# Patient Record
Sex: Female | Born: 1955
Health system: Southern US, Community
[De-identification: ages and names within clinical notes are randomized; demographics above are authoritative.]

## PROBLEM LIST (undated history)

## (undated) DIAGNOSIS — Z30430 Encounter for insertion of intrauterine contraceptive device: Secondary | ICD-10-CM

## (undated) DIAGNOSIS — IMO0002 Reserved for concepts with insufficient information to code with codable children: Secondary | ICD-10-CM

## (undated) DIAGNOSIS — F419 Anxiety disorder, unspecified: Secondary | ICD-10-CM

## (undated) DIAGNOSIS — M533 Sacrococcygeal disorders, not elsewhere classified: Secondary | ICD-10-CM

## (undated) DIAGNOSIS — M8580 Other specified disorders of bone density and structure, unspecified site: Secondary | ICD-10-CM

## (undated) DIAGNOSIS — G4730 Sleep apnea, unspecified: Secondary | ICD-10-CM

## (undated) DIAGNOSIS — N189 Chronic kidney disease, unspecified: Secondary | ICD-10-CM

## (undated) DIAGNOSIS — Z9889 Other specified postprocedural states: Secondary | ICD-10-CM

## (undated) DIAGNOSIS — D219 Benign neoplasm of connective and other soft tissue, unspecified: Secondary | ICD-10-CM

## (undated) DIAGNOSIS — M1990 Unspecified osteoarthritis, unspecified site: Secondary | ICD-10-CM

## (undated) DIAGNOSIS — R809 Proteinuria, unspecified: Secondary | ICD-10-CM

## (undated) DIAGNOSIS — F32A Depression, unspecified: Secondary | ICD-10-CM

## (undated) DIAGNOSIS — M549 Dorsalgia, unspecified: Secondary | ICD-10-CM

## (undated) DIAGNOSIS — C801 Malignant (primary) neoplasm, unspecified: Secondary | ICD-10-CM

## (undated) DIAGNOSIS — B029 Zoster without complications: Secondary | ICD-10-CM

## (undated) DIAGNOSIS — R112 Nausea with vomiting, unspecified: Secondary | ICD-10-CM

## (undated) HISTORY — DX: Other specified disorders of bone density and structure, unspecified site: M85.80

## (undated) HISTORY — PX: WISDOM TOOTH EXTRACTION: SHX21

## (undated) HISTORY — DX: Benign neoplasm of connective and other soft tissue, unspecified: D21.9

## (undated) HISTORY — DX: Sacrococcygeal disorders, not elsewhere classified: M53.3

## (undated) HISTORY — PX: COLONOSCOPY: SHX174

## (undated) HISTORY — PX: MULTIPLE TOOTH EXTRACTIONS: SHX2053

## (undated) HISTORY — PX: BUNIONECTOMY: SHX129

## (undated) HISTORY — DX: Encounter for insertion of intrauterine contraceptive device: Z30.430

## (undated) HISTORY — DX: Dorsalgia, unspecified: M54.9

## (undated) HISTORY — DX: Reserved for concepts with insufficient information to code with codable children: IMO0002

## (undated) HISTORY — DX: Proteinuria, unspecified: R80.9

---

## 1998-12-07 ENCOUNTER — Encounter: Payer: Self-pay | Admitting: Obstetrics and Gynecology

## 1998-12-07 ENCOUNTER — Ambulatory Visit (HOSPITAL_COMMUNITY): Admission: RE | Admit: 1998-12-07 | Discharge: 1998-12-07 | Payer: Self-pay | Admitting: Obstetrics and Gynecology

## 1998-12-09 ENCOUNTER — Ambulatory Visit (HOSPITAL_COMMUNITY): Admission: RE | Admit: 1998-12-09 | Discharge: 1998-12-09 | Payer: Self-pay | Admitting: Obstetrics and Gynecology

## 1998-12-09 ENCOUNTER — Encounter: Payer: Self-pay | Admitting: Obstetrics and Gynecology

## 1999-01-04 ENCOUNTER — Other Ambulatory Visit: Admission: RE | Admit: 1999-01-04 | Discharge: 1999-01-04 | Payer: Self-pay | Admitting: Obstetrics and Gynecology

## 2000-01-10 ENCOUNTER — Other Ambulatory Visit: Admission: RE | Admit: 2000-01-10 | Discharge: 2000-01-10 | Payer: Self-pay | Admitting: Obstetrics and Gynecology

## 2000-01-25 ENCOUNTER — Encounter: Payer: Self-pay | Admitting: Obstetrics and Gynecology

## 2000-01-25 ENCOUNTER — Ambulatory Visit (HOSPITAL_COMMUNITY): Admission: RE | Admit: 2000-01-25 | Discharge: 2000-01-25 | Payer: Self-pay | Admitting: Obstetrics and Gynecology

## 2000-08-05 ENCOUNTER — Emergency Department (HOSPITAL_COMMUNITY): Admission: EM | Admit: 2000-08-05 | Discharge: 2000-08-05 | Payer: Self-pay | Admitting: Emergency Medicine

## 2000-12-19 ENCOUNTER — Other Ambulatory Visit: Admission: RE | Admit: 2000-12-19 | Discharge: 2000-12-19 | Payer: Self-pay | Admitting: Obstetrics and Gynecology

## 2001-01-29 ENCOUNTER — Ambulatory Visit (HOSPITAL_COMMUNITY): Admission: RE | Admit: 2001-01-29 | Discharge: 2001-01-29 | Payer: Self-pay | Admitting: Obstetrics and Gynecology

## 2001-01-29 ENCOUNTER — Encounter: Payer: Self-pay | Admitting: Obstetrics and Gynecology

## 2001-02-05 ENCOUNTER — Encounter: Admission: RE | Admit: 2001-02-05 | Discharge: 2001-02-05 | Payer: Self-pay | Admitting: Obstetrics and Gynecology

## 2001-02-05 ENCOUNTER — Encounter: Payer: Self-pay | Admitting: Obstetrics and Gynecology

## 2002-01-15 ENCOUNTER — Other Ambulatory Visit: Admission: RE | Admit: 2002-01-15 | Discharge: 2002-01-15 | Payer: Self-pay | Admitting: Obstetrics and Gynecology

## 2002-02-06 ENCOUNTER — Ambulatory Visit (HOSPITAL_COMMUNITY): Admission: RE | Admit: 2002-02-06 | Discharge: 2002-02-06 | Payer: Self-pay | Admitting: Obstetrics and Gynecology

## 2002-02-06 ENCOUNTER — Encounter: Payer: Self-pay | Admitting: Obstetrics and Gynecology

## 2003-02-04 ENCOUNTER — Other Ambulatory Visit: Admission: RE | Admit: 2003-02-04 | Discharge: 2003-02-04 | Payer: Self-pay | Admitting: Obstetrics and Gynecology

## 2003-02-10 ENCOUNTER — Ambulatory Visit (HOSPITAL_COMMUNITY): Admission: RE | Admit: 2003-02-10 | Discharge: 2003-02-10 | Payer: Self-pay | Admitting: Obstetrics and Gynecology

## 2004-02-16 ENCOUNTER — Ambulatory Visit (HOSPITAL_COMMUNITY): Admission: RE | Admit: 2004-02-16 | Discharge: 2004-02-16 | Payer: Self-pay | Admitting: Obstetrics and Gynecology

## 2004-03-29 ENCOUNTER — Other Ambulatory Visit: Admission: RE | Admit: 2004-03-29 | Discharge: 2004-03-29 | Payer: Self-pay | Admitting: Obstetrics and Gynecology

## 2004-04-17 DIAGNOSIS — IMO0002 Reserved for concepts with insufficient information to code with codable children: Secondary | ICD-10-CM

## 2004-04-17 HISTORY — DX: Reserved for concepts with insufficient information to code with codable children: IMO0002

## 2005-02-28 ENCOUNTER — Ambulatory Visit (HOSPITAL_COMMUNITY): Admission: RE | Admit: 2005-02-28 | Discharge: 2005-02-28 | Payer: Self-pay | Admitting: Obstetrics and Gynecology

## 2005-03-29 ENCOUNTER — Other Ambulatory Visit: Admission: RE | Admit: 2005-03-29 | Discharge: 2005-03-29 | Payer: Self-pay | Admitting: Obstetrics and Gynecology

## 2006-03-06 ENCOUNTER — Ambulatory Visit (HOSPITAL_COMMUNITY): Admission: RE | Admit: 2006-03-06 | Discharge: 2006-03-06 | Payer: Self-pay | Admitting: Obstetrics and Gynecology

## 2006-04-17 DIAGNOSIS — Z30430 Encounter for insertion of intrauterine contraceptive device: Secondary | ICD-10-CM

## 2006-04-17 HISTORY — DX: Encounter for insertion of intrauterine contraceptive device: Z30.430

## 2007-01-18 ENCOUNTER — Encounter: Admission: RE | Admit: 2007-01-18 | Discharge: 2007-01-18 | Payer: Self-pay | Admitting: Family Medicine

## 2007-03-12 ENCOUNTER — Ambulatory Visit (HOSPITAL_COMMUNITY): Admission: RE | Admit: 2007-03-12 | Discharge: 2007-03-12 | Payer: Self-pay | Admitting: Obstetrics and Gynecology

## 2007-03-26 ENCOUNTER — Encounter: Admission: RE | Admit: 2007-03-26 | Discharge: 2007-03-26 | Payer: Self-pay | Admitting: Family Medicine

## 2007-04-22 ENCOUNTER — Encounter: Admission: RE | Admit: 2007-04-22 | Discharge: 2007-04-22 | Payer: Self-pay | Admitting: Family Medicine

## 2007-05-02 ENCOUNTER — Encounter: Admission: RE | Admit: 2007-05-02 | Discharge: 2007-05-02 | Payer: Self-pay | Admitting: Family Medicine

## 2007-05-21 ENCOUNTER — Encounter: Admission: RE | Admit: 2007-05-21 | Discharge: 2007-05-21 | Payer: Self-pay | Admitting: Family Medicine

## 2007-11-22 ENCOUNTER — Encounter: Admission: RE | Admit: 2007-11-22 | Discharge: 2007-11-22 | Payer: Self-pay | Admitting: Family Medicine

## 2007-11-25 ENCOUNTER — Encounter: Admission: RE | Admit: 2007-11-25 | Discharge: 2007-11-25 | Payer: Self-pay | Admitting: Family Medicine

## 2007-12-04 ENCOUNTER — Encounter: Admission: RE | Admit: 2007-12-04 | Discharge: 2007-12-04 | Payer: Self-pay | Admitting: Family Medicine

## 2007-12-25 ENCOUNTER — Encounter: Admission: RE | Admit: 2007-12-25 | Discharge: 2007-12-25 | Payer: Self-pay | Admitting: Family Medicine

## 2008-02-24 ENCOUNTER — Encounter: Admission: RE | Admit: 2008-02-24 | Discharge: 2008-02-24 | Payer: Self-pay | Admitting: Family Medicine

## 2008-03-23 ENCOUNTER — Ambulatory Visit (HOSPITAL_COMMUNITY): Admission: RE | Admit: 2008-03-23 | Discharge: 2008-03-23 | Payer: Self-pay | Admitting: Obstetrics and Gynecology

## 2008-04-02 ENCOUNTER — Encounter: Admission: RE | Admit: 2008-04-02 | Discharge: 2008-04-02 | Payer: Self-pay | Admitting: Obstetrics and Gynecology

## 2009-03-24 ENCOUNTER — Ambulatory Visit (HOSPITAL_COMMUNITY): Admission: RE | Admit: 2009-03-24 | Discharge: 2009-03-24 | Payer: Self-pay | Admitting: Obstetrics and Gynecology

## 2010-03-25 ENCOUNTER — Encounter
Admission: RE | Admit: 2010-03-25 | Discharge: 2010-03-25 | Payer: Self-pay | Source: Home / Self Care | Attending: Family Medicine | Admitting: Family Medicine

## 2010-04-17 DIAGNOSIS — C439 Malignant melanoma of skin, unspecified: Secondary | ICD-10-CM | POA: Insufficient documentation

## 2011-02-23 ENCOUNTER — Other Ambulatory Visit: Payer: Self-pay | Admitting: Family Medicine

## 2011-02-23 DIAGNOSIS — Z1231 Encounter for screening mammogram for malignant neoplasm of breast: Secondary | ICD-10-CM

## 2011-03-28 ENCOUNTER — Ambulatory Visit
Admission: RE | Admit: 2011-03-28 | Discharge: 2011-03-28 | Disposition: A | Discharge status: Home / Self Care | Payer: BC Managed Care – PPO | Source: Ambulatory Visit | Attending: Family Medicine | Admitting: Family Medicine

## 2011-03-28 DIAGNOSIS — Z1231 Encounter for screening mammogram for malignant neoplasm of breast: Secondary | ICD-10-CM

## 2011-08-23 ENCOUNTER — Telehealth: Payer: Self-pay | Admitting: Obstetrics and Gynecology

## 2011-08-23 NOTE — Telephone Encounter (Signed)
triage

## 2011-08-23 NOTE — Telephone Encounter (Signed)
Tc to pt. Pt c/o forgetfulness and problems with cognitive thinking. Pt states,"it is effecting normal daily life". Pt has researched that low testosterone could be a factor. Appt sched 09-14-11 with vph to discuss. Pt voices understanding.

## 2011-09-14 ENCOUNTER — Ambulatory Visit (INDEPENDENT_AMBULATORY_CARE_PROVIDER_SITE_OTHER): Payer: BC Managed Care – PPO | Admitting: Obstetrics and Gynecology

## 2011-09-14 ENCOUNTER — Encounter: Payer: Self-pay | Admitting: Obstetrics and Gynecology

## 2011-09-14 VITALS — BP 124/82 | Temp 98.7°F | Wt 155.0 lb

## 2011-09-14 DIAGNOSIS — N951 Menopausal and female climacteric states: Secondary | ICD-10-CM

## 2011-09-14 DIAGNOSIS — R809 Proteinuria, unspecified: Secondary | ICD-10-CM

## 2011-09-14 DIAGNOSIS — M533 Sacrococcygeal disorders, not elsewhere classified: Secondary | ICD-10-CM

## 2011-09-14 DIAGNOSIS — M549 Dorsalgia, unspecified: Secondary | ICD-10-CM

## 2011-09-14 DIAGNOSIS — M899 Disorder of bone, unspecified: Secondary | ICD-10-CM

## 2011-09-14 DIAGNOSIS — G4700 Insomnia, unspecified: Secondary | ICD-10-CM

## 2011-09-14 DIAGNOSIS — M949 Disorder of cartilage, unspecified: Secondary | ICD-10-CM

## 2011-09-14 DIAGNOSIS — M8580 Other specified disorders of bone density and structure, unspecified site: Secondary | ICD-10-CM

## 2011-09-14 MED ORDER — ALPRAZOLAM 0.5 MG PO TABS: 0.5000 mg | ORAL_TABLET | Freq: Every evening | ORAL | Status: DC | PRN

## 2011-09-14 NOTE — Progress Notes (Signed)
Pt c/o difficulty with cognitive thinking x3-4 weeks ago. Pt decided to discontinue Progesterone to see if it would help with cognitive thinking and it has improved greatly. Pt c/o hot flashes and difficulty sleeping when not using progesterone. Has used 0.25 mg Xanax prn sleep in past as prescribed by her primary care physician with good relief.  Insomnia has not been present every night, but estmates it may occur once or twice weekly. Review of sleep hygiene shows good sleep habits. Past medical history, medications, allergies, family history, vital signs and problem list were all reviewed. BP 124/82  Temp 98.7 F (37.1 C)  Wt 155 lb (70.308 kg)   Options for management reviewed:  1)Restart progesterone at 50 mg hs.  Pt is concerned about using progesterone at any dose  2)Use xanax on prn basis and journal frequency xanax is required  3)Use current dose progesterone and add estrogen.  Pt is hesitant to use any estrogen because of history of Melanoma  4)Consider other sleep aids such as: Melatonin       Trazadone       Ambien (not recommended)       Lunesta...understanding that all are centrally acting drugs and may cause similar sx. Pt opts for #2 and will F/U in 4 weeks for review of symptoms

## 2011-09-15 ENCOUNTER — Encounter: Payer: Self-pay | Admitting: Obstetrics and Gynecology

## 2011-09-15 DIAGNOSIS — M549 Dorsalgia, unspecified: Secondary | ICD-10-CM | POA: Insufficient documentation

## 2011-09-15 DIAGNOSIS — M533 Sacrococcygeal disorders, not elsewhere classified: Secondary | ICD-10-CM | POA: Insufficient documentation

## 2011-09-15 DIAGNOSIS — R809 Proteinuria, unspecified: Secondary | ICD-10-CM | POA: Insufficient documentation

## 2011-09-15 DIAGNOSIS — M8580 Other specified disorders of bone density and structure, unspecified site: Secondary | ICD-10-CM | POA: Insufficient documentation

## 2011-10-16 ENCOUNTER — Encounter: Payer: Self-pay | Admitting: Obstetrics and Gynecology

## 2011-10-16 ENCOUNTER — Ambulatory Visit (INDEPENDENT_AMBULATORY_CARE_PROVIDER_SITE_OTHER): Payer: BC Managed Care – PPO | Admitting: Obstetrics and Gynecology

## 2011-10-16 VITALS — BP 104/68 | Ht 67.0 in | Wt 152.0 lb

## 2011-10-16 DIAGNOSIS — G4700 Insomnia, unspecified: Secondary | ICD-10-CM

## 2011-10-16 DIAGNOSIS — N951 Menopausal and female climacteric states: Secondary | ICD-10-CM

## 2011-10-16 NOTE — Progress Notes (Signed)
SUBJECTIVE: Pt here to f/u on visit from 09/14/2010 for sleep issues.  Options for management reviewed at last visit: 1)Restart progesterone at 50 mg hs. Pt is concerned about using progesterone at any dose  2)Use xanax on prn basis and journal frequency xanax is required  3)Use current dose progesterone and add estrogen. Pt is hesitant to use any estrogen because of history of Melanoma  4)Consider other sleep aids such as: Melatonin  Trazadone  Ambien (not recommended)  Lunesta...understanding that all are centrally acting drugs and may cause similar sx. Pt opted for #2 Her log reveals: 4-5 nights of full sleep    Other nights awakened , some when she stayed awake a very long time    Has had 2 otho steriod injections    After steroid injection in back, used Xanax less frequently 4-5 times since injection (09/19/11)  OBJECTIVE: BP 104/68  Ht 5\' 7"  (1.702 m)  Wt 152 lb (68.947 kg)  BMI 23.81 kg/m2  ASSESSMENT: Improved brain clarity after dc/'ing progesterone by pt's assessment Minimal insomnia requiring xanax use Steroid injection with relief of orthopedic issues may improve sleep  RECOMMENDATION: Continue current regimen of prn xanax use for early awakening lasting more than 30-45 mins Follow up at [facility] or prn.

## 2012-01-26 ENCOUNTER — Other Ambulatory Visit: Payer: Self-pay | Admitting: Orthopedic Surgery

## 2012-02-05 ENCOUNTER — Encounter (HOSPITAL_COMMUNITY): Payer: Self-pay | Admitting: Pharmacy Technician

## 2012-02-07 ENCOUNTER — Encounter (HOSPITAL_COMMUNITY)
Admission: RE | Admit: 2012-02-07 | Discharge: 2012-02-07 | Disposition: A | Discharge status: Home / Self Care | Payer: BC Managed Care – PPO | Source: Ambulatory Visit | Attending: Orthopedic Surgery | Admitting: Orthopedic Surgery

## 2012-02-07 ENCOUNTER — Encounter (HOSPITAL_COMMUNITY): Payer: Self-pay

## 2012-02-07 HISTORY — DX: Unspecified osteoarthritis, unspecified site: M19.90

## 2012-02-07 HISTORY — DX: Nausea with vomiting, unspecified: Z98.890

## 2012-02-07 HISTORY — DX: Malignant (primary) neoplasm, unspecified: C80.1

## 2012-02-07 HISTORY — DX: Chronic kidney disease, unspecified: N18.9

## 2012-02-07 HISTORY — DX: Nausea with vomiting, unspecified: R11.2

## 2012-02-07 LAB — COMPREHENSIVE METABOLIC PANEL
ALT: 14 U/L (ref 0–35)
AST: 20 U/L (ref 0–37)
Albumin: 3.9 g/dL (ref 3.5–5.2)
Alkaline Phosphatase: 70 U/L (ref 39–117)
BUN: 18 mg/dL (ref 6–23)
CO2: 29 mEq/L (ref 19–32)
Calcium: 10.2 mg/dL (ref 8.4–10.5)
Chloride: 102 mEq/L (ref 96–112)
Creatinine, Ser: 0.65 mg/dL (ref 0.50–1.10)
GFR calc Af Amer: >90 mL/min (ref >90)
GFR calc non Af Amer: >90 mL/min (ref >90)
Glucose, Bld: 76 mg/dL (ref 70–99)
Potassium: 3.7 mEq/L (ref 3.5–5.1)
Sodium: 140 mEq/L (ref 135–145)
Total Bilirubin: 0.4 mg/dL (ref 0.3–1.2)
Total Protein: 7.2 g/dL (ref 6.0–8.3)

## 2012-02-07 LAB — PROTIME-INR
INR: 0.9 (ref 0.00–1.49)
Prothrombin Time: 12.1 seconds (ref 11.6–15.2)

## 2012-02-07 LAB — URINALYSIS, ROUTINE W REFLEX MICROSCOPIC
Bilirubin Urine: NEGATIVE
Glucose, UA: NEGATIVE mg/dL
Ketones, ur: NEGATIVE mg/dL
Leukocytes, UA: NEGATIVE
Nitrite: NEGATIVE
Protein, ur: NEGATIVE mg/dL
Specific Gravity, Urine: 1.007 (ref 1.005–1.030)
Urobilinogen, UA: 0.2 mg/dL (ref 0.0–1.0)
pH: 7.5 (ref 5.0–8.0)

## 2012-02-07 LAB — TYPE AND SCREEN
ABO/RH(D): O POS
Antibody Screen: NEGATIVE

## 2012-02-07 LAB — CBC WITH DIFFERENTIAL/PLATELET
Basophils Absolute: 0 10*3/uL (ref 0.0–0.1)
Basophils Relative: 0 % (ref 0–1)
Eosinophils Absolute: 0.1 10*3/uL (ref 0.0–0.7)
Eosinophils Relative: 1 % (ref 0–5)
HCT: 40.9 % (ref 36.0–46.0)
Hemoglobin: 13.6 g/dL (ref 12.0–15.0)
Lymphocytes Relative: 24 % (ref 12–46)
Lymphs Abs: 1.5 10*3/uL (ref 0.7–4.0)
MCH: 30.7 pg (ref 26.0–34.0)
MCHC: 33.3 g/dL (ref 30.0–36.0)
MCV: 92.3 fL (ref 78.0–100.0)
Monocytes Absolute: 0.7 10*3/uL (ref 0.1–1.0)
Monocytes Relative: 10 % (ref 3–12)
Neutro Abs: 4.2 10*3/uL (ref 1.7–7.7)
Neutrophils Relative %: 65 % (ref 43–77)
Platelets: 244 10*3/uL (ref 150–400)
RBC: 4.43 MIL/uL (ref 3.87–5.11)
RDW: 13.8 % (ref 11.5–15.5)
WBC: 6.5 10*3/uL (ref 4.0–10.5)

## 2012-02-07 LAB — URINE MICROSCOPIC-ADD ON

## 2012-02-07 LAB — APTT: aPTT: 30 seconds (ref 24–37)

## 2012-02-07 LAB — SURGICAL PCR SCREEN
MRSA, PCR: NEGATIVE
Staphylococcus aureus: NEGATIVE

## 2012-02-07 LAB — ABO/RH: ABO/RH(D): O POS

## 2012-02-07 NOTE — Pre-Procedure Instructions (Signed)
20 Bobbette Eakes  02/07/2012   Your procedure is scheduled on: 02/14/12 Wednesday   Report to [facility] at 630 AM.  Call this number if you have problems the morning of surgery: [phone]   Remember:   Do not eat food OR DRINK :After Midnight   Take these medicines the morning of surgery with A SIP OF WATER:  XANAX,  TRAMADOL IF NEEDED   Do not wear jewelry, make-up or nail polish.  Do not wear lotions, powders, or perfumes. You may wear deodorant.  Do not shave 48 hours prior to surgery. Men may shave face and neck.  Do not bring valuables to the hospital.  Contacts, dentures or bridgework may not be worn into surgery.  Leave suitcase in the car. After surgery it may be brought to your room.  For patients admitted to the hospital, checkout time is 11:00 AM the day of discharge.   Patients discharged the day of surgery will not be allowed to drive home.  Name and phone number of your driver:   Special Instructions: Shower using CHG 2 nights before surgery and the night before surgery.  If you shower the day of surgery use CHG.  Use special wash - you have one bottle of CHG for all showers.  You should use approximately 1/3 of the bottle for each shower.   Please read over the following fact sheets that you were given: Pain Booklet, Coughing and Deep Breathing, MRSA Information and Surgical Site Infection Prevention

## 2012-02-13 MED ORDER — CEFAZOLIN SODIUM-DEXTROSE 2-3 GM-% IV SOLR
2.0000 g | INTRAVENOUS | Status: AC
  Administered 2012-02-14: 2 g via INTRAVENOUS
  Filled 2012-02-13: qty 50

## 2012-02-14 ENCOUNTER — Ambulatory Visit (HOSPITAL_COMMUNITY)
Admission: RE | Admit: 2012-02-14 | Discharge: 2012-02-14 | Disposition: A | Discharge status: Home / Self Care | Payer: BC Managed Care – PPO | Source: Ambulatory Visit | Attending: Orthopedic Surgery | Admitting: Orthopedic Surgery

## 2012-02-14 ENCOUNTER — Ambulatory Visit (HOSPITAL_COMMUNITY): Payer: BC Managed Care – PPO | Admitting: Anesthesiology

## 2012-02-14 ENCOUNTER — Encounter (HOSPITAL_COMMUNITY): Payer: Self-pay | Admitting: Surgery

## 2012-02-14 ENCOUNTER — Encounter (HOSPITAL_COMMUNITY)
Admission: RE | Disposition: A | Discharge status: Home / Self Care | Payer: Self-pay | Source: Ambulatory Visit | Attending: Orthopedic Surgery

## 2012-02-14 ENCOUNTER — Encounter (HOSPITAL_COMMUNITY): Payer: Self-pay | Admitting: Anesthesiology

## 2012-02-14 ENCOUNTER — Ambulatory Visit (HOSPITAL_COMMUNITY): Payer: BC Managed Care – PPO

## 2012-02-14 DIAGNOSIS — Z01818 Encounter for other preprocedural examination: Secondary | ICD-10-CM | POA: Insufficient documentation

## 2012-02-14 DIAGNOSIS — Z01812 Encounter for preprocedural laboratory examination: Secondary | ICD-10-CM | POA: Insufficient documentation

## 2012-02-14 DIAGNOSIS — M533 Sacrococcygeal disorders, not elsewhere classified: Secondary | ICD-10-CM

## 2012-02-14 DIAGNOSIS — Z01810 Encounter for preprocedural cardiovascular examination: Secondary | ICD-10-CM | POA: Insufficient documentation

## 2012-02-14 HISTORY — PX: SACROILIAC JOINT FUSION: SHX6088

## 2012-02-14 SURGERY — SACROILIAC JOINT FUSION
Anesthesia: General | Site: Hip | Laterality: Left | Wound class: Clean

## 2012-02-14 MED ORDER — NEOSTIGMINE METHYLSULFATE 1 MG/ML IJ SOLN
INTRAMUSCULAR | Status: DC | PRN
  Administered 2012-02-14: 5 mg via INTRAVENOUS

## 2012-02-14 MED ORDER — ROCURONIUM BROMIDE 100 MG/10ML IV SOLN
INTRAVENOUS | Status: DC | PRN
  Administered 2012-02-14: 50 mg via INTRAVENOUS

## 2012-02-14 MED ORDER — BUPIVACAINE-EPINEPHRINE PF 0.25-1:200000 % IJ SOLN
INTRAMUSCULAR | Status: AC
  Filled 2012-02-14: qty 30

## 2012-02-14 MED ORDER — PROMETHAZINE HCL 25 MG/ML IJ SOLN
INTRAMUSCULAR | Status: AC
  Filled 2012-02-14: qty 1

## 2012-02-14 MED ORDER — POVIDONE-IODINE 7.5 % EX SOLN: Freq: Once | CUTANEOUS | Status: DC

## 2012-02-14 MED ORDER — GLYCOPYRROLATE 0.2 MG/ML IJ SOLN
INTRAMUSCULAR | Status: DC | PRN
  Administered 2012-02-14: 0.6 mg via INTRAVENOUS

## 2012-02-14 MED ORDER — MIDAZOLAM HCL 5 MG/5ML IJ SOLN
INTRAMUSCULAR | Status: DC | PRN
  Administered 2012-02-14: 2 mg via INTRAVENOUS

## 2012-02-14 MED ORDER — HYDROMORPHONE HCL PF 1 MG/ML IJ SOLN
0.5000 mg | Freq: Once | INTRAMUSCULAR | Status: AC
  Administered 2012-02-14: 0.5 mg via INTRAVENOUS

## 2012-02-14 MED ORDER — DEXTROSE 5 % IV SOLN
INTRAVENOUS | Status: DC | PRN
  Administered 2012-02-14: 08:00:00 via INTRAVENOUS

## 2012-02-14 MED ORDER — ONDANSETRON HCL 4 MG/2ML IJ SOLN
INTRAMUSCULAR | Status: DC | PRN
  Administered 2012-02-14: 4 mg via INTRAVENOUS

## 2012-02-14 MED ORDER — BUPIVACAINE-EPINEPHRINE PF 0.25-1:200000 % IJ SOLN
INTRAMUSCULAR | Status: DC | PRN
  Administered 2012-02-14: 20 mL

## 2012-02-14 MED ORDER — HYDROMORPHONE HCL PF 1 MG/ML IJ SOLN
INTRAMUSCULAR | Status: AC
  Filled 2012-02-14: qty 1

## 2012-02-14 MED ORDER — DEXAMETHASONE SODIUM PHOSPHATE 4 MG/ML IJ SOLN
INTRAMUSCULAR | Status: DC | PRN
  Administered 2012-02-14: 8 mg via INTRAVENOUS

## 2012-02-14 MED ORDER — METOCLOPRAMIDE HCL 5 MG/ML IJ SOLN
INTRAMUSCULAR | Status: DC | PRN
  Administered 2012-02-14: 10 mg via INTRAVENOUS

## 2012-02-14 MED ORDER — 0.9 % SODIUM CHLORIDE (POUR BTL) OPTIME
TOPICAL | Status: DC | PRN
  Administered 2012-02-14: 1000 mL

## 2012-02-14 MED ORDER — LIDOCAINE HCL (CARDIAC) 20 MG/ML IV SOLN
INTRAVENOUS | Status: DC | PRN
  Administered 2012-02-14: 40 mg via INTRAVENOUS
  Administered 2012-02-14: 60 mg via INTRAVENOUS

## 2012-02-14 MED ORDER — TRAMADOL HCL 50 MG PO TABS
50.0000 mg | ORAL_TABLET | Freq: Once | ORAL | Status: AC
  Administered 2012-02-14: 50 mg via ORAL
  Filled 2012-02-14 (×2): qty 1

## 2012-02-14 MED ORDER — FENTANYL CITRATE 0.05 MG/ML IJ SOLN
INTRAMUSCULAR | Status: DC | PRN
  Administered 2012-02-14 (×2): 100 ug via INTRAVENOUS
  Administered 2012-02-14 (×2): 50 ug via INTRAVENOUS

## 2012-02-14 MED ORDER — PROPOFOL 10 MG/ML IV BOLUS
INTRAVENOUS | Status: DC | PRN
  Administered 2012-02-14: 180 mg via INTRAVENOUS

## 2012-02-14 MED ORDER — ARTIFICIAL TEARS OP OINT
TOPICAL_OINTMENT | OPHTHALMIC | Status: DC | PRN
  Administered 2012-02-14: 1 via OPHTHALMIC

## 2012-02-14 MED ORDER — LACTATED RINGERS IV SOLN
INTRAVENOUS | Status: DC | PRN
  Administered 2012-02-14 (×2): via INTRAVENOUS

## 2012-02-14 MED ORDER — PROMETHAZINE HCL 25 MG/ML IJ SOLN
6.2500 mg | Freq: Once | INTRAMUSCULAR | Status: AC
  Administered 2012-02-14: 6.25 mg via INTRAVENOUS

## 2012-02-14 SURGICAL SUPPLY — 58 items
APL SKNCLS STERI-STRIP NONHPOA (GAUZE/BANDAGES/DRESSINGS) ×1
BAG DECANTER FOR FLEXI CONT (MISCELLANEOUS) ×2 IMPLANT
BENZOIN TINCTURE PRP APPL 2/3 (GAUZE/BANDAGES/DRESSINGS) ×2 IMPLANT
BLADE SURG 10 STRL SS (BLADE) ×2 IMPLANT
BLADE SURG 11 STRL SS (BLADE) ×2 IMPLANT
BLADE SURG ROTATE 9660 (MISCELLANEOUS) ×2 IMPLANT
CANISTER SUCTION 2500CC (MISCELLANEOUS) ×2 IMPLANT
CLOTH BEACON ORANGE TIMEOUT ST (SAFETY) ×2 IMPLANT
CLSR STERI-STRIP ANTIMIC 1/2X4 (GAUZE/BANDAGES/DRESSINGS) ×1 IMPLANT
COVER MAYO STAND STRL (DRAPES) ×2 IMPLANT
COVER SURGICAL LIGHT HANDLE (MISCELLANEOUS) ×4 IMPLANT
DRAPE C-ARM 42X72 X-RAY (DRAPES) ×1 IMPLANT
DRAPE C-ARMOR (DRAPES) ×1 IMPLANT
DRAPE INCISE IOBAN 66X45 STRL (DRAPES) ×2 IMPLANT
DRAPE ORTHO SPLIT 77X108 STRL (DRAPES) ×2
DRAPE POUCH INSTRU U-SHP 10X18 (DRAPES) ×2 IMPLANT
DRAPE SURG 17X23 STRL (DRAPES) ×6 IMPLANT
DRAPE SURG ORHT 6 SPLT 77X108 (DRAPES) ×1 IMPLANT
DURAPREP 26ML APPLICATOR (WOUND CARE) ×2 IMPLANT
ELECT CAUTERY BLADE 6.4 (BLADE) ×2 IMPLANT
ELECT REM PT RETURN 9FT ADLT (ELECTROSURGICAL) ×2
ELECTRODE REM PT RTRN 9FT ADLT (ELECTROSURGICAL) ×1 IMPLANT
GAUZE SPONGE 4X4 16PLY XRAY LF (GAUZE/BANDAGES/DRESSINGS) ×2 IMPLANT
GLOVE BIO SURGEON STRL SZ8 (GLOVE) ×3 IMPLANT
GLOVE BIOGEL PI IND STRL 8 (GLOVE) ×1 IMPLANT
GLOVE BIOGEL PI INDICATOR 8 (GLOVE) ×1
GOWN STRL NON-REIN LRG LVL3 (GOWN DISPOSABLE) ×4 IMPLANT
GOWN STRL REIN XL XLG (GOWN DISPOSABLE) ×2 IMPLANT
IMPL IFUSE 7.0X50MM (Rod) IMPLANT
IMPLANT IFUSE 7.0X50MM (Rod) ×2 IMPLANT
KIT BASIN OR (CUSTOM PROCEDURE TRAY) ×2 IMPLANT
KIT ROOM TURNOVER OR (KITS) ×2 IMPLANT
MANIFOLD NEPTUNE II (INSTRUMENTS) ×1 IMPLANT
NDL HYPO 25GX1X1/2 BEV (NEEDLE) ×1 IMPLANT
NEEDLE 22X1 1/2 (OR ONLY) (NEEDLE) ×2 IMPLANT
NEEDLE HYPO 25GX1X1/2 BEV (NEEDLE) ×2 IMPLANT
NS IRRIG 1000ML POUR BTL (IV SOLUTION) ×2 IMPLANT
PACK SURGICAL SETUP 50X90 (CUSTOM PROCEDURE TRAY) ×2 IMPLANT
PACK UNIVERSAL I (CUSTOM PROCEDURE TRAY) ×2 IMPLANT
PAD ARMBOARD 7.5X6 YLW CONV (MISCELLANEOUS) ×4 IMPLANT
PENCIL BUTTON HOLSTER BLD 10FT (ELECTRODE) ×2 IMPLANT
SPONGE GAUZE 4X4 12PLY (GAUZE/BANDAGES/DRESSINGS) ×2 IMPLANT
SPONGE LAP 18X18 X RAY DECT (DISPOSABLE) ×2 IMPLANT
STAPLER VISISTAT 35W (STAPLE) ×1 IMPLANT
STRIP CLOSURE SKIN 1/2X4 (GAUZE/BANDAGES/DRESSINGS) ×2 IMPLANT
SUT MNCRL AB 4-0 PS2 18 (SUTURE) ×2 IMPLANT
SUT VIC AB 0 CT1 18XCR BRD 8 (SUTURE) ×1 IMPLANT
SUT VIC AB 0 CT1 8-18 (SUTURE) ×2
SUT VIC AB 2-0 CT2 18 VCP726D (SUTURE) ×2 IMPLANT
SYR BULB IRRIGATION 50ML (SYRINGE) ×2 IMPLANT
SYR CONTROL 10ML LL (SYRINGE) ×2 IMPLANT
TAPE CLOTH SURG 4X10 WHT LF (GAUZE/BANDAGES/DRESSINGS) ×1 IMPLANT
TOWEL OR 17X24 6PK STRL BLUE (TOWEL DISPOSABLE) ×2 IMPLANT
TOWEL OR 17X26 10 PK STRL BLUE (TOWEL DISPOSABLE) ×4 IMPLANT
TUBE CONNECTING 12X1/4 (SUCTIONS) ×2 IMPLANT
UNDERPAD 30X30 INCONTINENT (UNDERPADS AND DIAPERS) ×1 IMPLANT
WATER STERILE IRR 1000ML POUR (IV SOLUTION) ×1 IMPLANT
YANKAUER SUCT BULB TIP NO VENT (SUCTIONS) ×2 IMPLANT

## 2012-02-14 NOTE — Anesthesia Procedure Notes (Signed)
Procedure Name: Intubation Date/Time: 02/14/2012 8:15 AM Performed by: Wray Kearns A Pre-anesthesia Checklist: Patient identified, Timeout performed, Emergency Drugs available, Suction available and Patient being monitored Patient Re-evaluated:Patient Re-evaluated prior to inductionOxygen Delivery Method: Circle system utilized Preoxygenation: Pre-oxygenation with 100% oxygen Intubation Type: IV induction and Cricoid Pressure applied Ventilation: Mask ventilation without difficulty Laryngoscope Size: Mac and 3 Grade View: Grade I Tube type: Oral Tube size: 7.0 mm Number of attempts: 1 Airway Equipment and Method: Stylet Placement Confirmation: ETT inserted through vocal cords under direct vision,  breath sounds checked- equal and bilateral and positive ETCO2 Secured at: 22 cm Tube secured with: Tape Dental Injury: Teeth and Oropharynx as per pre-operative assessment

## 2012-02-14 NOTE — Anesthesia Preprocedure Evaluation (Addendum)
Anesthesia Evaluation   Patient awake    Reviewed: Allergy & Precautions, H&P , NPO status   History of Anesthesia Complications (+) PONV  Airway       Dental   Pulmonary neg pulmonary ROS,          Cardiovascular negative cardio ROS      Neuro/Psych    GI/Hepatic negative GI ROS, Neg liver ROS,   Endo/Other    Renal/GU      Musculoskeletal   Abdominal   Peds  Hematology negative hematology ROS (+)   Anesthesia Other Findings   Reproductive/Obstetrics                          Anesthesia Physical Anesthesia Plan  ASA: II  Anesthesia Plan: General   Post-op Pain Management:    Induction: Intravenous  Airway Management Planned: Oral ETT  Additional Equipment:   Intra-op Plan:   Post-operative Plan: Extubation in OR  Informed Consent: I have reviewed the patients History and Physical, chart, labs and discussed the procedure including the risks, benefits and alternatives for the proposed anesthesia with the patient or authorized representative who has indicated his/her understanding and acceptance.   Dental advisory given  Plan Discussed with: CRNA, Anesthesiologist and Surgeon  Anesthesia Plan Comments:         Anesthesia Quick Evaluation

## 2012-02-14 NOTE — Transfer of Care (Signed)
Immediate Anesthesia Transfer of Care Note  Patient: Kelsey Fitzpatrick  Procedure(s) Performed: Procedure(s) (LRB) with comments: SACROILIAC JOINT FUSION (Left) - Left sacroiliac joint fusion  Patient Location: PACU  Anesthesia Type:General  Level of Consciousness: oriented, sedated, patient cooperative and responds to stimulation  Airway & Oxygen Therapy: Patient Spontanous Breathing and Patient connected to nasal cannula oxygen  Post-op Assessment: Report given to PACU RN, Post -op Vital signs reviewed and stable, Patient moving all extremities and Patient moving all extremities X 4  Post vital signs: Reviewed and stable  Complications: No apparent anesthesia complications

## 2012-02-14 NOTE — Anesthesia Postprocedure Evaluation (Signed)
Anesthesia Post Note  Patient: Kelsey Fitzpatrick  Procedure(s) Performed: Procedure(s) (LRB): SACROILIAC JOINT FUSION (Left)  Anesthesia type: general  Patient location: PACU  Post pain: Pain level controlled  Post assessment: Patient's Cardiovascular Status Stable  Last Vitals:  Filed Vitals:   02/14/12 1030  BP:   Pulse: 58  Temp:   Resp: 12    Post vital signs: Reviewed and stable  Level of consciousness: sedated  Complications: No apparent anesthesia complications

## 2012-02-14 NOTE — H&P (Signed)
PREOPERATIVE H&P  Chief Complaint: Left low back pain  HPI: Kelsey Fitzpatrick is a 56 y.o. female who presents with left low back pain  Past Medical History  Diagnosis Date  . SI (sacroiliac) joint dysfunction   . Osteopenia   . Back pain   . Encounter for insertion of mirena IUD 2008  . Proteinuria     ever since childhood  . Fibroids   . Abnormal Pap smear 2006    ASCUS  . PONV (postoperative nausea and vomiting)   . Chronic kidney disease     PROTEINURIA (INCREASED PROTEIN)  . Cancer     2012 LT ARM MELANOMA   . Arthritis     OA    Past Surgical History  Procedure Date  . Melanoma excision 2012    left arm  . Bunionectomy     BILAT 1995    History   Social History  . Marital Status: Single    Spouse Name: N/A    Number of Children: N/A  . Years of Education: N/A   Social History Main Topics  . Smoking status: Never Smoker   . Smokeless tobacco: Never Used  . Alcohol Use: Yes     OCC  . Drug Use: No  . Sexually Active: Yes    Birth Control/ Protection: None   Other Topics Concern  . Not on file   Social History Narrative  . No narrative on file   Family History  Problem Relation Age of Onset  . Kidney disease Father   . Dementia Mother   . Stroke Mother   . Rheum arthritis Mother    Allergies  Allergen Reactions  . Codeine Nausea Only    Dizziness  . Hydrocodone Nausea And Vomiting   Prior to Admission medications   Medication Sig Start Date End Date Taking? Authorizing Provider  ALPRAZolam Prudy Feeler) 0.5 MG tablet Take 0.25 mg by mouth at bedtime as needed. For sleep   Yes Historical Provider, MD  Calcium Citrate-Vitamin D 500-250 MG-UNIT PACK Take 2 tablets by mouth 2 (two) times daily.   Yes Historical Provider, MD  Multiple Vitamin (MULTIVITAMIN WITH MINERALS) TABS Take 1 tablet by mouth daily.   Yes Historical Provider, MD  Multiple Vitamins-Minerals (ICAPS LUTEIN & ZEAXANTHIN PO) Take 1 tablet by mouth daily.   Yes Historical Provider, MD    Omega-3 Fatty Acids (FISH OIL) 1200 MG CAPS Take 1,200 mg by mouth daily.   Yes Historical Provider, MD  OVER THE COUNTER MEDICATION Take 1 tablet by mouth at bedtime as needed. Calms Forte. For sleep   Yes Historical Provider, MD  Probiotic Product (PROBIOTIC PO) Take 1 capsule by mouth every evening.   Yes Historical Provider, MD  traMADol (ULTRAM) 50 MG tablet Take 25 mg by mouth 2 (two) times daily as needed. For pain   Yes Historical Provider, MD  meloxicam (MOBIC) 15 MG tablet Take 15 mg by mouth daily.    Historical Provider, MD     All other systems have been reviewed and were otherwise negative with the exception of those mentioned in the HPI and as above.  Physical Exam: There were no vitals filed for this visit.  General: Alert, no acute distress Cardiovascular: No pedal edema Respiratory: No cyanosis, no use of accessory musculature GI: No organomegaly, abdomen is soft and non-tender Skin: No lesions in the area of chief complaint Neurologic: Sensation intact distally Psychiatric: Patient is competent for consent with normal mood and affect Lymphatic: No axillary  or cervical lymphadenopathy  MUSCULOSKELETAL: + TTP left SI joint  Assessment/Plan: sacroiliac joint pain Plan for Procedure(s): SACROILIAC JOINT FUSION on LEFT   Emilee Hero, MD 02/14/2012 6:29 AM

## 2012-02-15 ENCOUNTER — Encounter (HOSPITAL_COMMUNITY): Payer: Self-pay | Admitting: Orthopedic Surgery

## 2012-02-15 NOTE — Op Note (Signed)
NAME:  ANALAYA, MILKO NO.:  [id number]  MEDICAL RECORD NO.:  [id number]  LOCATION:  [facility]                         FACILITY:  [facility]  PHYSICIAN:  Estill Bamberg, MD      DATE OF BIRTH:  [birth year]  DATE OF PROCEDURE:  02/14/2012 DATE OF DISCHARGE:  02/14/2012                              OPERATIVE REPORT   PREOPERATIVE DIAGNOSIS:  Left sacroiliac joint dysfunction.  POSTOPERATIVE DIAGNOSIS:  Left sacroiliac joint dysfunction.  PROCEDURE:  Left-sided sacroiliac joint fusion using the  iFuse implantation system.  SURGEON:  Estill Bamberg, MD  ASSISTANT:  None.  ANESTHESIA:  General endotracheal anesthesia.  COMPLICATIONS:  None.  DISPOSITION:  Stable.  ESTIMATED BLOOD LOSS:  Minimal.  INDICATIONS FOR PROCEDURE:  Briefly, Ms. Critser is an extremely pleasant 56 year old female who presented to me with severe pain in the region of the left hip.  Her exam was consistent with sacroiliac joint dysfunction.  Of particular note, the patient did have a left-sided sacroiliac joint injection and did note 8 months of relief after the injection.  She then got a second injection which did help her for a total of 1 hour.  She also had physical therapy and other forms of conservative care with no long-term improvement.  Given the patient's ongoing debilitating pain, we did have a discussion regarding going forward with the left-sided sacroiliac joint fusion.  The patient fully understood the risks and limitations of the procedure as outlined in my preoperative note.  OPERATIVE DETAILS:  On February 14, 2012, the patient was brought to surgery and general endotracheal anesthesia was administered.  The patient was placed prone on a well-padded flat Jackson bed with a Wilson frame.  Gel was placed on the patient's chest and hips.  Antibiotics were given and a time-out procedure was performed.  The area of the left buttock was prepped and draped in usual sterile fashion.  I  then made a 3 cm incision overlying the posterior border of the sacrum.  I then placed a guidewire across the superior aspect of the sacroiliac joint. Of particular note, the patient did have transitional anatomy and the superior aspect of the sacral ala was nonarticular.  I therefore did have to advance the guidewire across the upper aspect of the joint, which was in line with the S1 foramen.  This was advanced to the lateral aspect of the foramen.  I then drilled and broached per the manufacturer's recommendations.  I then placed a 7 x 45 mm implant across the sacroiliac joint.  The guidewire was then placed parallel to the first one just beneath it and just in between the S1 and S2 foramen. I again drilled and broached and I placed a 7 x 50 mm implant.  The third implant was placed in line with the S2 foramen at the inferior aspect of the joint.  Again, I drilled and broached and a 7 x 40 mm implant was placed across the joint in this location.  I was very pleased with the final construct.  Of note, I did use lateral inlet and outlet views while placing the guidewires and the implants.  I was extremely pleased with  the final appearance.  At this point, the wound was copiously irrigated.  The fascia was closed using 0 Vicryl.  The subcutaneous layer was closed using 2-0 Vicryl and the skin was closed using 3-0 Monocryl.  Benzoin and Steri-Strips were applied followed by sterile dressing.  All instrument counts were correct at the termination of the procedure.  The patient was then awoken from general endotracheal anesthesia and transferred to recovery in stable condition.     Estill Bamberg, MD     MD/MEDQ  D:  02/14/2012  T:  02/15/2012  Job:  [id number]  cc:   Gretta Arab. Valentina Lucks, M.D.

## 2012-02-29 ENCOUNTER — Telehealth: Payer: Self-pay | Admitting: Obstetrics and Gynecology

## 2012-02-29 MED ORDER — ALPRAZOLAM 0.5 MG PO TABS: 0.2500 mg | ORAL_TABLET | Freq: Every evening | ORAL | Status: DC | PRN

## 2012-02-29 NOTE — Telephone Encounter (Signed)
Tc to pt per telephone call. Rx for Xanax 0.5mg  e-pres to pharm on file per [facility]. Pt agrees.

## 2012-03-19 ENCOUNTER — Other Ambulatory Visit: Payer: Self-pay | Admitting: Family Medicine

## 2012-03-19 DIAGNOSIS — Z1231 Encounter for screening mammogram for malignant neoplasm of breast: Secondary | ICD-10-CM

## 2012-04-24 ENCOUNTER — Ambulatory Visit
Admission: RE | Admit: 2012-04-24 | Discharge: 2012-04-24 | Disposition: A | Discharge status: Home / Self Care | Payer: BC Managed Care – PPO | Source: Ambulatory Visit | Attending: Family Medicine | Admitting: Family Medicine

## 2012-04-24 DIAGNOSIS — Z1231 Encounter for screening mammogram for malignant neoplasm of breast: Secondary | ICD-10-CM

## 2012-08-23 ENCOUNTER — Encounter: Payer: Self-pay | Admitting: Obstetrics and Gynecology

## 2013-04-23 ENCOUNTER — Other Ambulatory Visit: Payer: Self-pay

## 2013-04-23 DIAGNOSIS — Z1231 Encounter for screening mammogram for malignant neoplasm of breast: Secondary | ICD-10-CM

## 2013-04-28 ENCOUNTER — Ambulatory Visit
Admission: RE | Admit: 2013-04-28 | Discharge: 2013-04-28 | Disposition: A | Discharge status: Home / Self Care | Payer: BC Managed Care – PPO | Source: Ambulatory Visit

## 2013-04-28 DIAGNOSIS — Z1231 Encounter for screening mammogram for malignant neoplasm of breast: Secondary | ICD-10-CM

## 2014-01-11 ENCOUNTER — Encounter (HOSPITAL_COMMUNITY): Payer: Self-pay | Admitting: Emergency Medicine

## 2014-01-11 ENCOUNTER — Emergency Department (HOSPITAL_COMMUNITY): Payer: BC Managed Care – PPO

## 2014-01-11 ENCOUNTER — Other Ambulatory Visit (HOSPITAL_COMMUNITY): Payer: BC Managed Care – PPO

## 2014-01-11 ENCOUNTER — Emergency Department (HOSPITAL_COMMUNITY)
Admission: EM | Admit: 2014-01-11 | Discharge: 2014-01-11 | Disposition: A | Discharge status: Home / Self Care | Payer: BC Managed Care – PPO | Attending: Emergency Medicine | Admitting: Emergency Medicine

## 2014-01-11 DIAGNOSIS — R109 Unspecified abdominal pain: Secondary | ICD-10-CM | POA: Diagnosis present

## 2014-01-11 DIAGNOSIS — N189 Chronic kidney disease, unspecified: Secondary | ICD-10-CM | POA: Insufficient documentation

## 2014-01-11 DIAGNOSIS — Z9889 Other specified postprocedural states: Secondary | ICD-10-CM | POA: Insufficient documentation

## 2014-01-11 DIAGNOSIS — M899 Disorder of bone, unspecified: Secondary | ICD-10-CM | POA: Diagnosis not present

## 2014-01-11 DIAGNOSIS — M949 Disorder of cartilage, unspecified: Secondary | ICD-10-CM

## 2014-01-11 DIAGNOSIS — N200 Calculus of kidney: Secondary | ICD-10-CM | POA: Diagnosis not present

## 2014-01-11 DIAGNOSIS — Z85820 Personal history of malignant melanoma of skin: Secondary | ICD-10-CM | POA: Insufficient documentation

## 2014-01-11 DIAGNOSIS — Z8739 Personal history of other diseases of the musculoskeletal system and connective tissue: Secondary | ICD-10-CM | POA: Insufficient documentation

## 2014-01-11 DIAGNOSIS — Z79899 Other long term (current) drug therapy: Secondary | ICD-10-CM | POA: Insufficient documentation

## 2014-01-11 LAB — CBC WITH DIFFERENTIAL/PLATELET
Basophils Absolute: 0 10*3/uL (ref 0.0–0.1)
Basophils Relative: 0 % (ref 0–1)
Eosinophils Absolute: 0.1 10*3/uL (ref 0.0–0.7)
Eosinophils Relative: 2 % (ref 0–5)
HCT: 37.8 % (ref 36.0–46.0)
Hemoglobin: 13 g/dL (ref 12.0–15.0)
Lymphocytes Relative: 25 % (ref 12–46)
Lymphs Abs: 1.3 10*3/uL (ref 0.7–4.0)
MCH: 30.8 pg (ref 26.0–34.0)
MCHC: 34.4 g/dL (ref 30.0–36.0)
MCV: 89.6 fL (ref 78.0–100.0)
Monocytes Absolute: 0.6 10*3/uL (ref 0.1–1.0)
Monocytes Relative: 11 % (ref 3–12)
Neutro Abs: 3.2 10*3/uL (ref 1.7–7.7)
Neutrophils Relative %: 62 % (ref 43–77)
Platelets: 219 10*3/uL (ref 150–400)
RBC: 4.22 MIL/uL (ref 3.87–5.11)
RDW: 14.1 % (ref 11.5–15.5)
WBC: 5.2 10*3/uL (ref 4.0–10.5)

## 2014-01-11 LAB — COMPREHENSIVE METABOLIC PANEL
ALT: 11 U/L (ref 0–35)
AST: 16 U/L (ref 0–37)
Albumin: 3.8 g/dL (ref 3.5–5.2)
Alkaline Phosphatase: 70 U/L (ref 39–117)
Anion gap: 11 (ref 5–15)
BUN: 17 mg/dL (ref 6–23)
CO2: 28 mEq/L (ref 19–32)
Calcium: 9.7 mg/dL (ref 8.4–10.5)
Chloride: 101 mEq/L (ref 96–112)
Creatinine, Ser: 0.7 mg/dL (ref 0.50–1.10)
GFR calc Af Amer: >90 mL/min (ref >90)
GFR calc non Af Amer: >90 mL/min (ref >90)
Glucose, Bld: 96 mg/dL (ref 70–99)
Potassium: 3.9 mEq/L (ref 3.7–5.3)
Sodium: 140 mEq/L (ref 137–147)
Total Bilirubin: 0.3 mg/dL (ref 0.3–1.2)
Total Protein: 6.6 g/dL (ref 6.0–8.3)

## 2014-01-11 LAB — URINALYSIS, ROUTINE W REFLEX MICROSCOPIC
Bilirubin Urine: NEGATIVE
Glucose, UA: NEGATIVE mg/dL
Ketones, ur: NEGATIVE mg/dL
Leukocytes, UA: NEGATIVE
Nitrite: NEGATIVE
Protein, ur: NEGATIVE mg/dL
Specific Gravity, Urine: 1.003 — ABNORMAL LOW (ref 1.005–1.030)
Urobilinogen, UA: 0.2 mg/dL (ref 0.0–1.0)
pH: 6.5 (ref 5.0–8.0)

## 2014-01-11 LAB — URINE MICROSCOPIC-ADD ON

## 2014-01-11 NOTE — ED Provider Notes (Signed)
CSN: [id number]     Arrival date & time 01/11/14  1015 History   First MD Initiated Contact with Patient 01/11/14 1115     Chief Complaint  Patient presents with  . Flank Pain     (Consider location/radiation/quality/duration/timing/severity/associated sxs/prior Treatment) HPI Comments: Patient is a 58 year old female with no significant past medical history. She presents with complaints of right flank pain that has been ongoing for several days. She was recently staying in a hotel room and felt as though this pain was from sleeping in a hotel bed. It is now radiating into her right lower abdomen and is concerned something else may be going on. She was seen by urgent care yesterday and had a urinalysis showing blood and negative laboratory studies. She has not getting much relief from tramadol and presents here for evaluation and possible imaging. She denies any fevers or chills. She denies any visible hematuria. She denies any relation to food.  Patient is a 58 y.o. female presenting with flank pain. The history is provided by the patient.  Flank Pain This is a new problem. The current episode started 2 days ago. The problem occurs constantly. The problem has been gradually worsening. Associated symptoms include abdominal pain. Nothing aggravates the symptoms. Nothing relieves the symptoms. Treatments tried: Tramadol. The treatment provided mild relief.    Past Medical History  Diagnosis Date  . SI (sacroiliac) joint dysfunction   . Osteopenia   . Back pain   . Encounter for insertion of mirena IUD 2008  . Proteinuria     ever since childhood  . Fibroids   . Abnormal Pap smear 2006    ASCUS  . PONV (postoperative nausea and vomiting)   . Chronic kidney disease     PROTEINURIA (INCREASED PROTEIN)  . Cancer     2012 LT ARM MELANOMA   . Arthritis     OA    Past Surgical History  Procedure Laterality Date  . Bunionectomy      BILAT 1995   . Sacroiliac joint fusion  02/14/2012   Procedure: SACROILIAC JOINT FUSION;  Surgeon: Sinclair Ship, MD;  Location: [facility];  Service: Orthopedics;  Laterality: Left;  Left sacroiliac joint fusion   Family History  Problem Relation Age of Onset  . Kidney disease Father   . Dementia Mother   . Stroke Mother   . Rheum arthritis Mother    History  Substance Use Topics  . Smoking status: Never Smoker   . Smokeless tobacco: Never Used  . Alcohol Use: Yes     Comment: OCC   OB History   Grav Para Term Preterm Abortions TAB SAB Ect Mult Living   0 0 0 0 0 0 0 0 0 0      Review of Systems  Gastrointestinal: Positive for abdominal pain.  Genitourinary: Positive for flank pain.  All other systems reviewed and are negative.     Allergies  Codeine and Hydrocodone  Home Medications   Prior to Admission medications   Medication Sig Start Date End Date Taking? Authorizing Provider  ALPRAZolam Duanne Moron) 0.5 MG tablet Take 0.5 mg by mouth at bedtime as needed for sleep.   Yes Historical Provider, MD  Calcium Citrate-Vitamin D 500-250 MG-UNIT PACK Take 2 tablets by mouth 2 (two) times daily.   Yes Historical Provider, MD  Multiple Vitamin (MULTIVITAMIN WITH MINERALS) TABS Take 1 tablet by mouth daily.   Yes Historical Provider, MD  Multiple Vitamins-Minerals (ICAPS LUTEIN & ZEAXANTHIN PO) Take  1 tablet by mouth daily.   Yes Historical Provider, MD  Omega-3 Fatty Acids (FISH OIL) 1200 MG CAPS Take 1,200 mg by mouth daily.   Yes Historical Provider, MD  traMADol (ULTRAM) 50 MG tablet Take 25 mg by mouth 2 (two) times daily as needed. For pain   Yes Historical Provider, MD   BP 130/93  Pulse 72  Temp(Src) 97.5 F (36.4 C) (Oral)  Resp 14  SpO2 95% Physical Exam  Nursing note and vitals reviewed. Constitutional: She is oriented to person, place, and time. She appears well-developed and well-nourished. No distress.  HENT:  Head: Normocephalic and atraumatic.  Neck: Normal range of motion. Neck supple.  Cardiovascular:  Normal rate and regular rhythm.  Exam reveals no gallop and no friction rub.   No murmur heard. Pulmonary/Chest: Effort normal and breath sounds normal. No respiratory distress. She has no wheezes.  Abdominal: Soft. Bowel sounds are normal. She exhibits no distension. There is tenderness. There is no rebound and no guarding.  There is tenderness to palpation in the right upper quadrant, right midabdomen, and right flank.  Musculoskeletal: Normal range of motion.  Neurological: She is alert and oriented to person, place, and time.  Skin: Skin is warm and dry. She is not diaphoretic.    ED Course  Procedures (including critical care time) Labs Review Labs Reviewed  URINALYSIS, ROUTINE W REFLEX MICROSCOPIC - Abnormal; Notable for the following:    Specific Gravity, Urine 1.003 (*)    Hgb urine dipstick MODERATE (*)    All other components within normal limits  URINE MICROSCOPIC-ADD ON  CBC WITH DIFFERENTIAL  COMPREHENSIVE METABOLIC PANEL    Imaging Review No results found.   EKG Interpretation None      MDM   Final diagnoses:  None    Patient presents with complaints of right flank pain. Workup reveals microscopic hematuria and CT scan confirms a 1 mm stone at the distal UVJ. She is declining further pain medication and will continue taking her tramadol. I've advised her to strain her urine and return as needed if she develops any fever, worsening pain, or other problems.    Veryl Speak, MD 01/11/14 [phone]

## 2014-01-11 NOTE — ED Notes (Signed)
Patient returned from CT

## 2014-01-11 NOTE — ED Notes (Signed)
MD at the bedside  

## 2014-01-11 NOTE — Discharge Instructions (Signed)
Continue your tramadol as before.  Followup with [facility] if you are not improving in the next 2-3 days. The contact information has been provided in this discharge summary.   Return to the emergency department if you develop worsening pain, high fever, vomiting, or other new and concerning symptoms.   Kidney Stones Kidney stones (urolithiasis) are deposits that form inside your kidneys. The intense pain is caused by the stone moving through the urinary tract. When the stone moves, the ureter goes into spasm around the stone. The stone is usually passed in the urine.  CAUSES   A disorder that makes certain neck glands produce too much parathyroid hormone (primary hyperparathyroidism).  A buildup of uric acid crystals, similar to gout in your joints.  Narrowing (stricture) of the ureter.  A kidney obstruction present at birth (congenital obstruction).  Previous surgery on the kidney or ureters.  Numerous kidney infections. SYMPTOMS   Feeling sick to your stomach (nauseous).  Throwing up (vomiting).  Blood in the urine (hematuria).  Pain that usually spreads (radiates) to the groin.  Frequency or urgency of urination. DIAGNOSIS   Taking a history and physical exam.  Blood or urine tests.  CT scan.  Occasionally, an examination of the inside of the urinary bladder (cystoscopy) is performed. TREATMENT   Observation.  Increasing your fluid intake.  Extracorporeal shock wave lithotripsy--This is a noninvasive procedure that uses shock waves to break up kidney stones.  Surgery may be needed if you have severe pain or persistent obstruction. There are various surgical procedures. Most of the procedures are performed with the use of small instruments. Only small incisions are needed to accommodate these instruments, so recovery time is minimized. The size, location, and chemical composition are all important variables that will determine the proper choice of action  for you. Talk to your health care provider to better understand your situation so that you will minimize the risk of injury to yourself and your kidney.  HOME CARE INSTRUCTIONS   Drink enough water and fluids to keep your urine clear or pale yellow. This will help you to pass the stone or stone fragments.  Strain all urine through the provided strainer. Keep all particulate matter and stones for your health care provider to see. The stone causing the pain may be as small as a grain of salt. It is very important to use the strainer each and every time you pass your urine. The collection of your stone will allow your health care provider to analyze it and verify that a stone has actually passed. The stone analysis will often identify what you can do to reduce the incidence of recurrences.  Only take over-the-counter or prescription medicines for pain, discomfort, or fever as directed by your health care provider.  Make a follow-up appointment with your health care provider as directed.  Get follow-up X-rays if required. The absence of pain does not always mean that the stone has passed. It may have only stopped moving. If the urine remains completely obstructed, it can cause loss of kidney function or even complete destruction of the kidney. It is your responsibility to make sure X-rays and follow-ups are completed. Ultrasounds of the kidney can show blockages and the status of the kidney. Ultrasounds are not associated with any radiation and can be performed easily in a matter of minutes. SEEK MEDICAL CARE IF:  You experience pain that is progressive and unresponsive to any pain medicine you have been prescribed. [place]  IF:   Pain cannot be controlled with the prescribed medicine.  You have a fever or shaking chills.  The severity or intensity of pain increases over 18 hours and is not relieved by pain medicine.  You develop a new onset of abdominal pain.  You feel faint  or pass out.  You are unable to urinate. MAKE SURE YOU:   Understand these instructions.  Will watch your condition.  Will get help right away if you are not doing well or get worse. Document Released: 04/03/2005 Document Revised: 12/04/2012 Document Reviewed: 09/04/2012 [facility] Patient Information 2015 [place], [place]. This information is not intended to replace advice given to you by your health care provider. Make sure you discuss any questions you have with your health care provider.

## 2014-01-11 NOTE — ED Notes (Signed)
Phlebotomy at the bedside  

## 2014-01-11 NOTE — ED Notes (Signed)
X 3 days ago: started with rt. Lower back and now is on rt. Side of abd. Hurts with movement. 9/26/15[name] After hours clinic - ua showed blood. Hx. Of uti's, and family hx of alport-syndrome - proteinuria since childhood.

## 2014-01-13 ENCOUNTER — Emergency Department (HOSPITAL_COMMUNITY): Payer: BC Managed Care – PPO

## 2014-01-13 ENCOUNTER — Encounter (HOSPITAL_COMMUNITY): Payer: Self-pay | Admitting: Emergency Medicine

## 2014-01-13 ENCOUNTER — Emergency Department (HOSPITAL_COMMUNITY)
Admission: EM | Admit: 2014-01-13 | Discharge: 2014-01-13 | Disposition: A | Discharge status: Home / Self Care | Payer: BC Managed Care – PPO | Attending: Emergency Medicine | Admitting: Emergency Medicine

## 2014-01-13 DIAGNOSIS — M129 Arthropathy, unspecified: Secondary | ICD-10-CM | POA: Diagnosis not present

## 2014-01-13 DIAGNOSIS — F411 Generalized anxiety disorder: Secondary | ICD-10-CM | POA: Diagnosis not present

## 2014-01-13 DIAGNOSIS — Z79899 Other long term (current) drug therapy: Secondary | ICD-10-CM | POA: Insufficient documentation

## 2014-01-13 DIAGNOSIS — N23 Unspecified renal colic: Secondary | ICD-10-CM | POA: Insufficient documentation

## 2014-01-13 DIAGNOSIS — R350 Frequency of micturition: Secondary | ICD-10-CM | POA: Insufficient documentation

## 2014-01-13 DIAGNOSIS — R109 Unspecified abdominal pain: Secondary | ICD-10-CM | POA: Insufficient documentation

## 2014-01-13 DIAGNOSIS — R61 Generalized hyperhidrosis: Secondary | ICD-10-CM | POA: Insufficient documentation

## 2014-01-13 DIAGNOSIS — Z85820 Personal history of malignant melanoma of skin: Secondary | ICD-10-CM | POA: Diagnosis not present

## 2014-01-13 DIAGNOSIS — R6883 Chills (without fever): Secondary | ICD-10-CM | POA: Diagnosis not present

## 2014-01-13 LAB — COMPREHENSIVE METABOLIC PANEL
ALT: 12 U/L (ref 0–35)
AST: 16 U/L (ref 0–37)
Albumin: 4 g/dL (ref 3.5–5.2)
Alkaline Phosphatase: 73 U/L (ref 39–117)
Anion gap: 13 (ref 5–15)
BUN: 12 mg/dL (ref 6–23)
CO2: 26 mEq/L (ref 19–32)
Calcium: 9.6 mg/dL (ref 8.4–10.5)
Chloride: 101 mEq/L (ref 96–112)
Creatinine, Ser: 0.65 mg/dL (ref 0.50–1.10)
GFR calc Af Amer: >90 mL/min (ref >90)
GFR calc non Af Amer: >90 mL/min (ref >90)
Glucose, Bld: 124 mg/dL — ABNORMAL HIGH (ref 70–99)
Potassium: 3.6 mEq/L — ABNORMAL LOW (ref 3.7–5.3)
Sodium: 140 mEq/L (ref 137–147)
Total Bilirubin: 0.5 mg/dL (ref 0.3–1.2)
Total Protein: 7 g/dL (ref 6.0–8.3)

## 2014-01-13 LAB — URINALYSIS, ROUTINE W REFLEX MICROSCOPIC
Bilirubin Urine: NEGATIVE
Glucose, UA: NEGATIVE mg/dL
Ketones, ur: NEGATIVE mg/dL
Leukocytes, UA: NEGATIVE
Nitrite: NEGATIVE
Protein, ur: NEGATIVE mg/dL
Specific Gravity, Urine: 1.011 (ref 1.005–1.030)
Urobilinogen, UA: 0.2 mg/dL (ref 0.0–1.0)
pH: 6 (ref 5.0–8.0)

## 2014-01-13 LAB — CBC
HCT: 40.9 % (ref 36.0–46.0)
Hemoglobin: 13.5 g/dL (ref 12.0–15.0)
MCH: 30.3 pg (ref 26.0–34.0)
MCHC: 33 g/dL (ref 30.0–36.0)
MCV: 91.7 fL (ref 78.0–100.0)
Platelets: 236 10*3/uL (ref 150–400)
RBC: 4.46 MIL/uL (ref 3.87–5.11)
RDW: 14.1 % (ref 11.5–15.5)
WBC: 6.4 10*3/uL (ref 4.0–10.5)

## 2014-01-13 LAB — LIPASE, BLOOD: Lipase: 30 U/L (ref 11–59)

## 2014-01-13 LAB — URINE MICROSCOPIC-ADD ON

## 2014-01-13 MED ORDER — KETOROLAC TROMETHAMINE 30 MG/ML IJ SOLN: 30.0000 mg | Freq: Once | INTRAMUSCULAR | Status: DC

## 2014-01-13 MED ORDER — MORPHINE SULFATE 4 MG/ML IJ SOLN
4.0000 mg | Freq: Once | INTRAMUSCULAR | Status: AC
  Administered 2014-01-13: 4 mg via INTRAVENOUS
  Filled 2014-01-13: qty 1

## 2014-01-13 MED ORDER — KETOROLAC TROMETHAMINE 30 MG/ML IJ SOLN
30.0000 mg | Freq: Once | INTRAMUSCULAR | Status: AC
  Administered 2014-01-13: 30 mg via INTRAVENOUS
  Filled 2014-01-13: qty 1

## 2014-01-13 MED ORDER — ONDANSETRON HCL 4 MG PO TABS: 4.0000 mg | ORAL_TABLET | Freq: Four times a day (QID) | ORAL | Status: DC

## 2014-01-13 MED ORDER — PHENAZOPYRIDINE HCL 200 MG PO TABS: 200.0000 mg | ORAL_TABLET | Freq: Three times a day (TID) | ORAL | Status: DC

## 2014-01-13 MED ORDER — TAMSULOSIN HCL 0.4 MG PO CAPS
0.8000 mg | ORAL_CAPSULE | Freq: Once | ORAL | Status: AC
  Administered 2014-01-13: 0.8 mg via ORAL
  Filled 2014-01-13: qty 2

## 2014-01-13 MED ORDER — ONDANSETRON 4 MG PO TBDP
2.0000 mg | ORAL_TABLET | Freq: Once | ORAL | Status: AC
  Administered 2014-01-13: 2 mg via ORAL
  Filled 2014-01-13: qty 1

## 2014-01-13 MED ORDER — OXYCODONE-ACETAMINOPHEN 5-325 MG PO TABS: 1.0000 | ORAL_TABLET | ORAL | Status: DC | PRN

## 2014-01-13 MED ORDER — ONDANSETRON HCL 4 MG/2ML IJ SOLN
4.0000 mg | Freq: Once | INTRAMUSCULAR | Status: AC
  Administered 2014-01-13: 4 mg via INTRAVENOUS
  Filled 2014-01-13: qty 2

## 2014-01-13 MED ORDER — OXYBUTYNIN CHLORIDE ER 10 MG PO TB24: 10.0000 mg | ORAL_TABLET | Freq: Every day | ORAL | Status: DC

## 2014-01-13 MED ORDER — SODIUM CHLORIDE 0.9 % IV BOLUS (SEPSIS)
1000.0000 mL | Freq: Once | INTRAVENOUS | Status: AC
  Administered 2014-01-13: 1000 mL via INTRAVENOUS

## 2014-01-13 NOTE — ED Notes (Signed)
Presents with right flank pain, seen Sunday for kidney stone, no V/D/F, is A/O X4, ambulatory and in NAD

## 2014-01-13 NOTE — ED Provider Notes (Signed)
CSN: [id number]     Arrival date & time 01/13/14  0535 History   First MD Initiated Contact with Patient 01/13/14 0617     Chief Complaint  Patient presents with  . Flank Pain     (Consider location/radiation/quality/duration/timing/severity/associated sxs/prior Treatment) HPI Comments: Kelsey Fitzpatrick is a(n) 58 y.o. female who presents with CC R flank pain. The patient was seen 2 days ago for the same with + hematuria and a 86mm urolith in the R UVJ. Patient was to ld to fu with urology, however she could not get an appointment until the 6th of Oct. Patient was told by the staff to return to the ED if her sxs were worsening or uncontrolled. The patient has had intolerable and worsening pain at home int the R flunk and RUQ of the abdomen. She has associated nausea and diaphoresis with chills. She denies vomiting or fevers. She has hx of proteinuria and a family hx of Alports syndrome.  Patient is a 58 y.o. female presenting with flank pain. The history is provided by the patient.  Flank Pain This is a recurrent problem. The current episode started in the past 7 days. The problem occurs constantly. The problem has been gradually worsening. Associated symptoms include abdominal pain (RUQ), anorexia, chills, diaphoresis, nausea and urinary symptoms. Pertinent negatives include no arthralgias, change in bowel habit, chest pain, congestion, coughing, fatigue, fever, headaches, joint swelling, myalgias, neck pain, numbness, rash, sore throat, swollen glands, vertigo, visual change, vomiting or weakness. Nothing aggravates the symptoms. She has tried acetaminophen and oral narcotics (tramadol and acetaminophen) for the symptoms. The treatment provided moderate relief.    Kelsey Fitzpatrick is a(n) 58 y.o. female who presents    Past Medical History  Diagnosis Date  . SI (sacroiliac) joint dysfunction   . Osteopenia   . Back pain   . Encounter for insertion of mirena IUD 2008  . Proteinuria     ever since  childhood  . Fibroids   . Abnormal Pap smear 2006    ASCUS  . PONV (postoperative nausea and vomiting)   . Chronic kidney disease     PROTEINURIA (INCREASED PROTEIN)  . Cancer     2012 LT ARM MELANOMA   . Arthritis     OA    Past Surgical History  Procedure Laterality Date  . Bunionectomy      BILAT 1995   . Sacroiliac joint fusion  02/14/2012    Procedure: SACROILIAC JOINT FUSION;  Surgeon: Sinclair Ship, MD;  Location: [facility];  Service: Orthopedics;  Laterality: Left;  Left sacroiliac joint fusion   Family History  Problem Relation Age of Onset  . Kidney disease Father   . Dementia Mother   . Stroke Mother   . Rheum arthritis Mother    History  Substance Use Topics  . Smoking status: Never Smoker   . Smokeless tobacco: Never Used  . Alcohol Use: No     Comment: OCC   OB History   Grav Para Term Preterm Abortions TAB SAB Ect Mult Living   0 0 0 0 0 0 0 0 0 0      Review of Systems  Constitutional: Positive for chills, diaphoresis, activity change and appetite change. Negative for fever and fatigue.  HENT: Negative for congestion and sore throat.   Respiratory: Negative for cough.   Cardiovascular: Negative for chest pain.  Gastrointestinal: Positive for nausea, abdominal pain (RUQ) and anorexia. Negative for vomiting and change in bowel habit.  Genitourinary:  Positive for frequency and flank pain.  Musculoskeletal: Negative for arthralgias, joint swelling, myalgias and neck pain.  Skin: Negative for rash.  Neurological: Negative for vertigo, weakness, numbness and headaches.  All other systems reviewed and are negative.     Allergies  Codeine and Hydrocodone  Home Medications   Prior to Admission medications   Medication Sig Start Date End Date Taking? Authorizing Provider  ALPRAZolam Duanne Moron) 0.5 MG tablet Take 0.5 mg by mouth at bedtime as needed for sleep.   Yes Historical Provider, MD  Calcium Citrate-Vitamin D 500-250 MG-UNIT PACK Take 2 tablets  by mouth 2 (two) times daily.   Yes Historical Provider, MD  estradiol (ESTRACE) 0.1 MG/GM vaginal cream Place 1 Applicatorful vaginally 2 (two) times a week.   Yes Historical Provider, MD  Multiple Vitamin (MULTIVITAMIN WITH MINERALS) TABS Take 1 tablet by mouth daily.   Yes Historical Provider, MD  Multiple Vitamins-Minerals (ICAPS LUTEIN & ZEAXANTHIN PO) Take 1 tablet by mouth daily.   Yes Historical Provider, MD  Omega-3 Fatty Acids (FISH OIL) 1200 MG CAPS Take 1,200 mg by mouth daily.   Yes Historical Provider, MD  traMADol (ULTRAM) 50 MG tablet Take 25 mg by mouth 2 (two) times daily as needed. For pain   Yes Historical Provider, MD   BP 151/103  Pulse 73  Temp(Src) 98.6 F (37 C) (Oral)  Resp 18  Ht 5\' 9"  (1.753 m)  Wt 150 lb (68.04 kg)  BMI 22.14 kg/m2  SpO2 100% Physical Exam  Nursing note and vitals reviewed. Constitutional: She is oriented to person, place, and time. She appears well-developed and well-nourished. No distress.  Appears uncomfortable  HENT:  Head: Normocephalic and atraumatic.  Eyes: Conjunctivae are normal. No scleral icterus.  Neck: Normal range of motion.  Cardiovascular: Normal rate, regular rhythm and normal heart sounds.  Exam reveals no gallop and no friction rub.   No murmur heard. Pulmonary/Chest: Effort normal and breath sounds normal. No respiratory distress.  Abdominal: Soft. Bowel sounds are normal. She exhibits no distension and no mass. There is tenderness (RUQ tenderness, equivocal murphy's, minimal CVA). There is no guarding.  Neurological: She is alert and oriented to person, place, and time.  Skin: Skin is warm and dry. She is not diaphoretic.    ED Course  Procedures (including critical care time) Labs Review Labs Reviewed  CBC  URINALYSIS, ROUTINE W REFLEX MICROSCOPIC  COMPREHENSIVE METABOLIC PANEL  LIPASE, BLOOD    Imaging Review Ct Abdomen Pelvis Wo Contrast  01/11/2014   CLINICAL DATA:  Right flank pain and right-sided  pain. History of SI joint fusion. History of melanoma 2012.  EXAM: CT ABDOMEN AND PELVIS WITHOUT CONTRAST  TECHNIQUE: Multidetector CT imaging of the abdomen and pelvis was performed following the standard protocol without IV contrast.  COMPARISON:  Ultrasound the abdomen on 11/25/2007  FINDINGS: Lower chest: Lung bases are unremarkable.  Heart size is normal.  Upper abdomen: No focal abnormality identified within the knee liver, spleen, pancreas, or adrenal glands. There is mild right-sided hydronephrosis and hydroureter secondary to a 1 mm stone at the distal most portion of the ureterovesical junction. There are numerous bilateral intrarenal cysts. There are small bilateral parenchymal calcifications. The gallbladder is present.  Bowel: The stomach and small bowel loops have a normal appearance. There is significant stool burden.  Pelvis: The uterus is present and appears retroverted. No adnexal mass or free pelvic fluid identified. There is streak artifact within the pelvis from previous SI joint fusion  on the left.  Retroperitoneum: No retroperitoneal or mesenteric adenopathy. No evidence for aortic aneurysm.  Abdominal wall: Unremarkable.  Osseous structures: Left SI joint fusion. No acute abnormalities. No suspicious lytic or blastic lesions.  IMPRESSION: 1. Right hydronephrosis and hydroureter secondary to a 1 mm stone in the distal most portion of the ureterovesical junction. 2. Bilateral intrarenal cyst. 3. Small bilateral intrarenal calcifications. 4. Significant stool burden. 5. Previous left SI joint fusion.   Electronically Signed   By: Shon Hale M.D.   On: 01/11/2014 13:52     EKG Interpretation None      MDM   Final diagnoses:  Ureteral colic    Patient returns with increased pain since discharge 2 days ago. She has been taking tramadol and acetaminophen without relief,  US shows resolving hydroureteronephrosis. No abnormalities of th gallbladder. Patient pain controlled in the ED no  signs of kidney dysfunction.  D/c home with stronger pain meds,oxybutynin and pyridium.   Laryah Neuser, PA-C 01/16/14 2104

## 2014-01-13 NOTE — Discharge Instructions (Signed)
Kidney Stones °Kidney stones (urolithiasis) are deposits that form inside your kidneys. The intense pain is caused by the stone moving through the urinary tract. When the stone moves, the ureter goes into spasm around the stone. The stone is usually passed in the urine.  °CAUSES  °· A disorder that makes certain neck glands produce too much parathyroid hormone (primary hyperparathyroidism). °· A buildup of uric acid crystals, similar to gout in your joints. °· Narrowing (stricture) of the ureter. °· A kidney obstruction present at birth (congenital obstruction). °· Previous surgery on the kidney or ureters. °· Numerous kidney infections. °SYMPTOMS  °· Feeling sick to your stomach (nauseous). °· Throwing up (vomiting). °· Blood in the urine (hematuria). °· Pain that usually spreads (radiates) to the groin. °· Frequency or urgency of urination. °DIAGNOSIS  °· Taking a history and physical exam. °· Blood or urine tests. °· CT scan. °· Occasionally, an examination of the inside of the urinary bladder (cystoscopy) is performed. °TREATMENT  °· Observation. °· Increasing your fluid intake. °· Extracorporeal shock wave lithotripsy--This is a noninvasive procedure that uses shock waves to break up kidney stones. °· Surgery may be needed if you have severe pain or persistent obstruction. There are various surgical procedures. Most of the procedures are performed with the use of small instruments. Only small incisions are needed to accommodate these instruments, so recovery time is minimized. °The size, location, and chemical composition are all important variables that will determine the proper choice of action for you. Talk to your health care provider to better understand your situation so that you will minimize the risk of injury to yourself and your kidney.  °HOME CARE INSTRUCTIONS  °· Drink enough water and fluids to keep your urine clear or pale yellow. This will help you to pass the stone or stone fragments. °· Strain  all urine through the provided strainer. Keep all particulate matter and stones for your health care provider to see. The stone causing the pain may be as small as a grain of salt. It is very important to use the strainer each and every time you pass your urine. The collection of your stone will allow your health care provider to analyze it and verify that a stone has actually passed. The stone analysis will often identify what you can do to reduce the incidence of recurrences. °· Only take over-the-counter or prescription medicines for pain, discomfort, or fever as directed by your health care provider. °· Make a follow-up appointment with your health care provider as directed. °· Get follow-up X-rays if required. The absence of pain does not always mean that the stone has passed. It may have only stopped moving. If the urine remains completely obstructed, it can cause loss of kidney function or even complete destruction of the kidney. It is your responsibility to make sure X-rays and follow-ups are completed. Ultrasounds of the kidney can show blockages and the status of the kidney. Ultrasounds are not associated with any radiation and can be performed easily in a matter of minutes. °SEEK MEDICAL CARE IF: °· You experience pain that is progressive and unresponsive to any pain medicine you have been prescribed. °SEEK IMMEDIATE MEDICAL CARE IF:  °· Pain cannot be controlled with the prescribed medicine. °· You have a fever or shaking chills. °· The severity or intensity of pain increases over 18 hours and is not relieved by pain medicine. °· You develop a new onset of abdominal pain. °· You feel faint or pass out. °·   You are unable to urinate. °MAKE SURE YOU:  °· Understand these instructions. °· Will watch your condition. °· Will get help right away if you are not doing well or get worse. °Document Released: 04/03/2005 Document Revised: 12/04/2012 Document Reviewed: 09/04/2012 °[employer]® Patient Information ©2015  [employer]. This information is not intended to replace advice given to you by your health care provider. Make sure you discuss any questions you have with your health care provider. ° °Ureteral Colic (Kidney Stones) °Ureteral colic is the result of a condition when kidney stones form inside the kidney. Once kidney stones are formed they may move into the tube that connects the kidney with the bladder (ureter). If this occurs, this condition may cause pain (colic) in the ureter.  °CAUSES  °Pain is caused by stone movement in the ureter and the obstruction caused by the stone. °SYMPTOMS  °The pain comes and goes as the ureter contracts around the stone. The pain is usually intense, sharp, and stabbing in character. The location of the pain may move as the stone moves through the ureter. When the stone is near the kidney the pain is usually located in the back and radiates to the belly (abdomen). When the stone is ready to pass into the bladder the pain is often located in the lower abdomen on the side the stone is located. At this location, the symptoms may mimic those of a urinary tract infection with urinary frequency. Once the stone is located here it often passes into the bladder and the pain disappears completely. °TREATMENT  °· Your caregiver will provide you with medicine for pain relief. °· You may require specialized follow-up X-rays. °· The absence of pain does not always mean that the stone has passed. It may have just stopped moving. If the urine remains completely obstructed, it can cause loss of kidney function or even complete destruction of the involved kidney. It is your responsibility and in your interest that X-rays and follow-ups as suggested by your caregiver are completed. Relief of pain without passage of the stone can be associated with severe damage to the kidney, including loss of kidney function on that side. °· If your stone does not pass on its own, additional measures may be taken by  your caregiver to ensure its removal. °HOME CARE INSTRUCTIONS  °· Increase your fluid intake. Water is the preferred fluid since juices containing vitamin C may acidify the urine making it less likely for certain stones (uric acid stones) to pass. °· Strain all urine. A strainer will be provided. Keep all particulate matter or stones for your caregiver to inspect. °· Take your pain medicine as directed. °· Make a follow-up appointment with your caregiver as directed. °· Remember that the goal is passage of your stone. The absence of pain does not mean the stone is gone. Follow your caregiver's instructions. °· Only take over-the-counter or prescription medicines for pain, discomfort, or fever as directed by your caregiver. °SEEK MEDICAL CARE IF:  °· Pain cannot be controlled with the prescribed medicine. °· You have a fever. °· Pain continues for longer than your caregiver advises it should. °· There is a change in the pain, and you develop chest discomfort or constant abdominal pain. °· You feel faint or pass out. °MAKE SURE YOU:  °· Understand these instructions. °· Will watch your condition. °· Will get help right away if you are not doing well or get worse. °Document Released: 01/11/2005 Document Revised: 07/29/2012 Document Reviewed: 09/28/2010 °ExitCare®   Patient Information 2015 [place]. This information is not intended to replace advice given to you by your health care provider. Make sure you discuss any questions you have with your health care provider.

## 2014-01-13 NOTE — ED Notes (Signed)
Transported to ultrasound

## 2014-01-18 NOTE — ED Provider Notes (Signed)
Medical screening examination/treatment/procedure(s) were performed by non-physician practitioner and as supervising physician I was immediately available for consultation/collaboration.   EKG Interpretation None       Kalman Drape, MD 01/18/14 [id number]

## 2014-03-23 ENCOUNTER — Other Ambulatory Visit: Payer: Self-pay

## 2014-03-23 DIAGNOSIS — Z1231 Encounter for screening mammogram for malignant neoplasm of breast: Secondary | ICD-10-CM

## 2014-04-29 ENCOUNTER — Encounter (INDEPENDENT_AMBULATORY_CARE_PROVIDER_SITE_OTHER): Payer: Self-pay

## 2014-04-29 ENCOUNTER — Ambulatory Visit: Payer: BC Managed Care – PPO

## 2014-04-29 ENCOUNTER — Ambulatory Visit
Admission: RE | Admit: 2014-04-29 | Discharge: 2014-04-29 | Disposition: A | Discharge status: Home / Self Care | Payer: BLUE CROSS/BLUE SHIELD | Source: Ambulatory Visit

## 2014-04-29 DIAGNOSIS — Z1231 Encounter for screening mammogram for malignant neoplasm of breast: Secondary | ICD-10-CM

## 2014-06-30 DIAGNOSIS — L718 Other rosacea: Secondary | ICD-10-CM | POA: Insufficient documentation

## 2015-02-26 ENCOUNTER — Other Ambulatory Visit: Payer: Self-pay | Admitting: Family Medicine

## 2015-02-26 ENCOUNTER — Ambulatory Visit
Admission: RE | Admit: 2015-02-26 | Discharge: 2015-02-26 | Disposition: A | Discharge status: Home / Self Care | Payer: BLUE CROSS/BLUE SHIELD | Source: Ambulatory Visit | Attending: Family Medicine | Admitting: Family Medicine

## 2015-02-26 DIAGNOSIS — R05 Cough: Secondary | ICD-10-CM

## 2015-02-26 DIAGNOSIS — R059 Cough, unspecified: Secondary | ICD-10-CM

## 2015-02-26 DIAGNOSIS — R0789 Other chest pain: Secondary | ICD-10-CM

## 2015-03-22 ENCOUNTER — Other Ambulatory Visit: Payer: Self-pay

## 2015-03-22 DIAGNOSIS — Z1231 Encounter for screening mammogram for malignant neoplasm of breast: Secondary | ICD-10-CM

## 2015-05-03 ENCOUNTER — Ambulatory Visit
Admission: RE | Admit: 2015-05-03 | Discharge: 2015-05-03 | Disposition: A | Discharge status: Home / Self Care | Payer: BLUE CROSS/BLUE SHIELD | Source: Ambulatory Visit

## 2015-05-03 DIAGNOSIS — Z1231 Encounter for screening mammogram for malignant neoplasm of breast: Secondary | ICD-10-CM

## 2015-09-17 DIAGNOSIS — Z01411 Encounter for gynecological examination (general) (routine) with abnormal findings: Secondary | ICD-10-CM | POA: Diagnosis not present

## 2015-09-17 DIAGNOSIS — Z8742 Personal history of other diseases of the female genital tract: Secondary | ICD-10-CM | POA: Diagnosis not present

## 2015-09-17 DIAGNOSIS — D259 Leiomyoma of uterus, unspecified: Secondary | ICD-10-CM | POA: Diagnosis not present

## 2015-09-17 DIAGNOSIS — N952 Postmenopausal atrophic vaginitis: Secondary | ICD-10-CM | POA: Diagnosis not present

## 2015-09-24 DIAGNOSIS — Z0000 Encounter for general adult medical examination without abnormal findings: Secondary | ICD-10-CM | POA: Diagnosis not present

## 2015-09-24 DIAGNOSIS — N951 Menopausal and female climacteric states: Secondary | ICD-10-CM | POA: Diagnosis not present

## 2015-09-24 DIAGNOSIS — I7300 Raynaud's syndrome without gangrene: Secondary | ICD-10-CM | POA: Diagnosis not present

## 2015-09-24 DIAGNOSIS — F39 Unspecified mood [affective] disorder: Secondary | ICD-10-CM | POA: Diagnosis not present

## 2015-10-21 DIAGNOSIS — L820 Inflamed seborrheic keratosis: Secondary | ICD-10-CM | POA: Diagnosis not present

## 2015-10-21 DIAGNOSIS — D692 Other nonthrombocytopenic purpura: Secondary | ICD-10-CM | POA: Diagnosis not present

## 2015-11-22 DIAGNOSIS — H04123 Dry eye syndrome of bilateral lacrimal glands: Secondary | ICD-10-CM | POA: Diagnosis not present

## 2015-11-22 DIAGNOSIS — H0289 Other specified disorders of eyelid: Secondary | ICD-10-CM | POA: Diagnosis not present

## 2015-11-22 DIAGNOSIS — L906 Striae atrophicae: Secondary | ICD-10-CM | POA: Diagnosis not present

## 2015-11-22 DIAGNOSIS — L718 Other rosacea: Secondary | ICD-10-CM | POA: Diagnosis not present

## 2015-12-24 DIAGNOSIS — F39 Unspecified mood [affective] disorder: Secondary | ICD-10-CM | POA: Diagnosis not present

## 2015-12-24 DIAGNOSIS — N951 Menopausal and female climacteric states: Secondary | ICD-10-CM | POA: Diagnosis not present

## 2015-12-24 DIAGNOSIS — R5383 Other fatigue: Secondary | ICD-10-CM | POA: Diagnosis not present

## 2015-12-30 DIAGNOSIS — D1801 Hemangioma of skin and subcutaneous tissue: Secondary | ICD-10-CM | POA: Diagnosis not present

## 2015-12-30 DIAGNOSIS — Z85820 Personal history of malignant melanoma of skin: Secondary | ICD-10-CM | POA: Diagnosis not present

## 2015-12-30 DIAGNOSIS — L821 Other seborrheic keratosis: Secondary | ICD-10-CM | POA: Diagnosis not present

## 2015-12-30 DIAGNOSIS — D2262 Melanocytic nevi of left upper limb, including shoulder: Secondary | ICD-10-CM | POA: Diagnosis not present

## 2015-12-30 DIAGNOSIS — C44519 Basal cell carcinoma of skin of other part of trunk: Secondary | ICD-10-CM | POA: Diagnosis not present

## 2016-03-14 ENCOUNTER — Ambulatory Visit
Admission: RE | Admit: 2016-03-14 | Discharge: 2016-03-14 | Disposition: A | Discharge status: Home / Self Care | Payer: BLUE CROSS/BLUE SHIELD | Source: Ambulatory Visit | Attending: Physician Assistant | Admitting: Physician Assistant

## 2016-03-14 ENCOUNTER — Encounter: Payer: Self-pay | Admitting: Cardiology

## 2016-03-14 ENCOUNTER — Other Ambulatory Visit: Payer: Self-pay | Admitting: Physician Assistant

## 2016-03-14 DIAGNOSIS — R05 Cough: Secondary | ICD-10-CM | POA: Diagnosis not present

## 2016-03-14 DIAGNOSIS — R079 Chest pain, unspecified: Secondary | ICD-10-CM | POA: Diagnosis not present

## 2016-03-14 DIAGNOSIS — R059 Cough, unspecified: Secondary | ICD-10-CM

## 2016-03-16 ENCOUNTER — Ambulatory Visit (INDEPENDENT_AMBULATORY_CARE_PROVIDER_SITE_OTHER): Payer: BLUE CROSS/BLUE SHIELD

## 2016-03-16 DIAGNOSIS — R079 Chest pain, unspecified: Secondary | ICD-10-CM | POA: Diagnosis not present

## 2016-03-16 LAB — EXERCISE TOLERANCE TEST
Estimated workload: 10.9 METS
Exercise duration (min): 9 min
Exercise duration (sec): 31 s
MPHR: 162 {beats}/min
Peak HR: 151 {beats}/min
Percent HR: 93 %
Percent of predicted max HR: 93 %
RPE: 16
Rest HR: 64 {beats}/min
Stage 1 DBP: 75 mmHg
Stage 1 Grade: 0 %
Stage 1 HR: 67 {beats}/min
Stage 1 SBP: 114 mmHg
Stage 1 Speed: 0 mph
Stage 10 DBP: 79 mmHg
Stage 10 Grade: 0 %
Stage 10 HR: 85 {beats}/min
Stage 10 SBP: 124 mmHg
Stage 10 Speed: 0 mph
Stage 2 Grade: 0 %
Stage 2 HR: 73 {beats}/min
Stage 2 Speed: 0 mph
Stage 3 Grade: 0 %
Stage 3 HR: 72 {beats}/min
Stage 3 Speed: 1 mph
Stage 4 Grade: 0.1 %
Stage 4 HR: 72 {beats}/min
Stage 4 Speed: 1 mph
Stage 5 DBP: 70 mmHg
Stage 5 Grade: 10 %
Stage 5 HR: 100 {beats}/min
Stage 5 SBP: 132 mmHg
Stage 5 Speed: 1.7 mph
Stage 6 DBP: 70 mmHg
Stage 6 Grade: 12 %
Stage 6 HR: 116 {beats}/min
Stage 6 SBP: 146 mmHg
Stage 6 Speed: 2.5 mph
Stage 7 DBP: 73 mmHg
Stage 7 Grade: 14 %
Stage 7 HR: 139 {beats}/min
Stage 7 SBP: 141 mmHg
Stage 7 Speed: 3.4 mph
Stage 8 Grade: 16 %
Stage 8 HR: 151 {beats}/min
Stage 8 Speed: 4.2 mph
Stage 9 DBP: 76 mmHg
Stage 9 Grade: 0 %
Stage 9 HR: 116 {beats}/min
Stage 9 SBP: 138 mmHg
Stage 9 Speed: 1.5 mph

## 2016-03-28 ENCOUNTER — Other Ambulatory Visit: Payer: Self-pay | Admitting: Obstetrics and Gynecology

## 2016-03-28 DIAGNOSIS — Z1231 Encounter for screening mammogram for malignant neoplasm of breast: Secondary | ICD-10-CM

## 2016-04-26 DIAGNOSIS — R079 Chest pain, unspecified: Secondary | ICD-10-CM | POA: Diagnosis not present

## 2016-04-26 DIAGNOSIS — K2970 Gastritis, unspecified, without bleeding: Secondary | ICD-10-CM | POA: Diagnosis not present

## 2016-05-03 ENCOUNTER — Ambulatory Visit: Payer: BLUE CROSS/BLUE SHIELD

## 2016-05-24 ENCOUNTER — Ambulatory Visit
Admission: RE | Admit: 2016-05-24 | Discharge: 2016-05-24 | Disposition: A | Discharge status: Home / Self Care | Payer: PRIVATE HEALTH INSURANCE | Source: Ambulatory Visit | Attending: Obstetrics and Gynecology | Admitting: Obstetrics and Gynecology

## 2016-05-24 DIAGNOSIS — Z1231 Encounter for screening mammogram for malignant neoplasm of breast: Secondary | ICD-10-CM | POA: Diagnosis not present

## 2016-05-26 ENCOUNTER — Other Ambulatory Visit: Payer: Self-pay | Admitting: Obstetrics and Gynecology

## 2016-05-26 DIAGNOSIS — R928 Other abnormal and inconclusive findings on diagnostic imaging of breast: Secondary | ICD-10-CM

## 2016-05-30 ENCOUNTER — Ambulatory Visit
Admission: RE | Admit: 2016-05-30 | Discharge: 2016-05-30 | Disposition: A | Discharge status: Home / Self Care | Payer: PRIVATE HEALTH INSURANCE | Source: Ambulatory Visit | Attending: Obstetrics and Gynecology | Admitting: Obstetrics and Gynecology

## 2016-05-30 DIAGNOSIS — N6001 Solitary cyst of right breast: Secondary | ICD-10-CM | POA: Diagnosis not present

## 2016-05-30 DIAGNOSIS — R928 Other abnormal and inconclusive findings on diagnostic imaging of breast: Secondary | ICD-10-CM

## 2016-05-30 DIAGNOSIS — N6310 Unspecified lump in the right breast, unspecified quadrant: Secondary | ICD-10-CM | POA: Diagnosis not present

## 2016-07-24 DIAGNOSIS — C44529 Squamous cell carcinoma of skin of other part of trunk: Secondary | ICD-10-CM | POA: Diagnosis not present

## 2016-09-18 DIAGNOSIS — G4700 Insomnia, unspecified: Secondary | ICD-10-CM | POA: Diagnosis not present

## 2016-09-18 DIAGNOSIS — N952 Postmenopausal atrophic vaginitis: Secondary | ICD-10-CM | POA: Diagnosis not present

## 2016-09-18 DIAGNOSIS — Z01411 Encounter for gynecological examination (general) (routine) with abnormal findings: Secondary | ICD-10-CM | POA: Diagnosis not present

## 2016-09-18 DIAGNOSIS — B009 Herpesviral infection, unspecified: Secondary | ICD-10-CM | POA: Diagnosis not present

## 2016-10-10 DIAGNOSIS — Z13220 Encounter for screening for lipoid disorders: Secondary | ICD-10-CM | POA: Diagnosis not present

## 2016-10-10 DIAGNOSIS — Z0000 Encounter for general adult medical examination without abnormal findings: Secondary | ICD-10-CM | POA: Diagnosis not present

## 2016-10-10 DIAGNOSIS — Z131 Encounter for screening for diabetes mellitus: Secondary | ICD-10-CM | POA: Diagnosis not present

## 2016-12-11 DIAGNOSIS — H04123 Dry eye syndrome of bilateral lacrimal glands: Secondary | ICD-10-CM | POA: Diagnosis not present

## 2016-12-11 DIAGNOSIS — L718 Other rosacea: Secondary | ICD-10-CM | POA: Diagnosis not present

## 2016-12-11 DIAGNOSIS — L906 Striae atrophicae: Secondary | ICD-10-CM | POA: Diagnosis not present

## 2016-12-11 DIAGNOSIS — H0289 Other specified disorders of eyelid: Secondary | ICD-10-CM | POA: Diagnosis not present

## 2016-12-26 DIAGNOSIS — L821 Other seborrheic keratosis: Secondary | ICD-10-CM | POA: Diagnosis not present

## 2017-01-02 DIAGNOSIS — Z85820 Personal history of malignant melanoma of skin: Secondary | ICD-10-CM | POA: Diagnosis not present

## 2017-01-02 DIAGNOSIS — D2262 Melanocytic nevi of left upper limb, including shoulder: Secondary | ICD-10-CM | POA: Diagnosis not present

## 2017-01-02 DIAGNOSIS — D485 Neoplasm of uncertain behavior of skin: Secondary | ICD-10-CM | POA: Diagnosis not present

## 2017-01-02 DIAGNOSIS — Z85828 Personal history of other malignant neoplasm of skin: Secondary | ICD-10-CM | POA: Diagnosis not present

## 2017-01-02 DIAGNOSIS — L821 Other seborrheic keratosis: Secondary | ICD-10-CM | POA: Diagnosis not present

## 2017-01-02 DIAGNOSIS — L570 Actinic keratosis: Secondary | ICD-10-CM | POA: Diagnosis not present

## 2017-04-30 ENCOUNTER — Other Ambulatory Visit: Payer: Self-pay | Admitting: Obstetrics and Gynecology

## 2017-04-30 DIAGNOSIS — Z139 Encounter for screening, unspecified: Secondary | ICD-10-CM

## 2017-05-25 ENCOUNTER — Ambulatory Visit: Payer: PRIVATE HEALTH INSURANCE

## 2017-05-28 ENCOUNTER — Ambulatory Visit
Admission: RE | Admit: 2017-05-28 | Discharge: 2017-05-28 | Disposition: A | Discharge status: Home / Self Care | Payer: PRIVATE HEALTH INSURANCE | Source: Ambulatory Visit | Attending: Obstetrics and Gynecology | Admitting: Obstetrics and Gynecology

## 2017-05-28 DIAGNOSIS — Z1231 Encounter for screening mammogram for malignant neoplasm of breast: Secondary | ICD-10-CM | POA: Diagnosis not present

## 2017-05-28 DIAGNOSIS — Z139 Encounter for screening, unspecified: Secondary | ICD-10-CM

## 2017-08-15 DIAGNOSIS — J069 Acute upper respiratory infection, unspecified: Secondary | ICD-10-CM | POA: Diagnosis not present

## 2017-08-15 DIAGNOSIS — R05 Cough: Secondary | ICD-10-CM | POA: Diagnosis not present

## 2017-08-21 ENCOUNTER — Ambulatory Visit
Admission: RE | Admit: 2017-08-21 | Discharge: 2017-08-21 | Disposition: A | Discharge status: Home / Self Care | Payer: PRIVATE HEALTH INSURANCE | Source: Ambulatory Visit | Attending: Family Medicine | Admitting: Family Medicine

## 2017-08-21 ENCOUNTER — Other Ambulatory Visit: Payer: Self-pay | Admitting: Family Medicine

## 2017-08-21 DIAGNOSIS — R05 Cough: Secondary | ICD-10-CM

## 2017-08-21 DIAGNOSIS — R059 Cough, unspecified: Secondary | ICD-10-CM

## 2017-08-25 DIAGNOSIS — J069 Acute upper respiratory infection, unspecified: Secondary | ICD-10-CM | POA: Diagnosis not present

## 2017-09-03 DIAGNOSIS — J45909 Unspecified asthma, uncomplicated: Secondary | ICD-10-CM | POA: Diagnosis not present

## 2017-10-02 DIAGNOSIS — D259 Leiomyoma of uterus, unspecified: Secondary | ICD-10-CM | POA: Diagnosis not present

## 2017-10-02 DIAGNOSIS — Z6825 Body mass index (BMI) 25.0-25.9, adult: Secondary | ICD-10-CM | POA: Diagnosis not present

## 2017-10-02 DIAGNOSIS — Z87448 Personal history of other diseases of urinary system: Secondary | ICD-10-CM | POA: Diagnosis not present

## 2017-10-02 DIAGNOSIS — Z01411 Encounter for gynecological examination (general) (routine) with abnormal findings: Secondary | ICD-10-CM | POA: Diagnosis not present

## 2017-10-02 DIAGNOSIS — Z8742 Personal history of other diseases of the female genital tract: Secondary | ICD-10-CM | POA: Diagnosis not present

## 2017-10-02 DIAGNOSIS — F329 Major depressive disorder, single episode, unspecified: Secondary | ICD-10-CM | POA: Insufficient documentation

## 2017-10-02 DIAGNOSIS — F32A Depression, unspecified: Secondary | ICD-10-CM | POA: Insufficient documentation

## 2017-12-19 DIAGNOSIS — L718 Other rosacea: Secondary | ICD-10-CM | POA: Diagnosis not present

## 2017-12-19 DIAGNOSIS — H02889 Meibomian gland dysfunction of unspecified eye, unspecified eyelid: Secondary | ICD-10-CM | POA: Diagnosis not present

## 2017-12-19 DIAGNOSIS — L906 Striae atrophicae: Secondary | ICD-10-CM | POA: Diagnosis not present

## 2017-12-19 DIAGNOSIS — H04123 Dry eye syndrome of bilateral lacrimal glands: Secondary | ICD-10-CM | POA: Diagnosis not present

## 2018-01-08 DIAGNOSIS — Z85828 Personal history of other malignant neoplasm of skin: Secondary | ICD-10-CM | POA: Diagnosis not present

## 2018-01-08 DIAGNOSIS — Z85820 Personal history of malignant melanoma of skin: Secondary | ICD-10-CM | POA: Diagnosis not present

## 2018-01-08 DIAGNOSIS — D2262 Melanocytic nevi of left upper limb, including shoulder: Secondary | ICD-10-CM | POA: Diagnosis not present

## 2018-02-13 DIAGNOSIS — L570 Actinic keratosis: Secondary | ICD-10-CM | POA: Diagnosis not present

## 2018-03-27 DIAGNOSIS — D692 Other nonthrombocytopenic purpura: Secondary | ICD-10-CM | POA: Diagnosis not present

## 2018-03-29 DIAGNOSIS — F39 Unspecified mood [affective] disorder: Secondary | ICD-10-CM | POA: Diagnosis not present

## 2018-03-29 DIAGNOSIS — I7300 Raynaud's syndrome without gangrene: Secondary | ICD-10-CM | POA: Diagnosis not present

## 2018-03-29 DIAGNOSIS — Z0000 Encounter for general adult medical examination without abnormal findings: Secondary | ICD-10-CM | POA: Diagnosis not present

## 2018-03-29 DIAGNOSIS — N951 Menopausal and female climacteric states: Secondary | ICD-10-CM | POA: Diagnosis not present

## 2018-03-29 DIAGNOSIS — Z1159 Encounter for screening for other viral diseases: Secondary | ICD-10-CM | POA: Diagnosis not present

## 2018-04-30 ENCOUNTER — Other Ambulatory Visit: Payer: Self-pay | Admitting: Family Medicine

## 2018-04-30 DIAGNOSIS — Z1231 Encounter for screening mammogram for malignant neoplasm of breast: Secondary | ICD-10-CM

## 2018-05-29 ENCOUNTER — Ambulatory Visit: Payer: PRIVATE HEALTH INSURANCE

## 2018-06-19 ENCOUNTER — Ambulatory Visit
Admission: RE | Admit: 2018-06-19 | Discharge: 2018-06-19 | Disposition: A | Discharge status: Home / Self Care | Payer: BLUE CROSS/BLUE SHIELD | Source: Ambulatory Visit | Attending: Family Medicine | Admitting: Family Medicine

## 2018-06-19 DIAGNOSIS — Z1231 Encounter for screening mammogram for malignant neoplasm of breast: Secondary | ICD-10-CM | POA: Diagnosis not present

## 2018-09-13 DIAGNOSIS — S61219A Laceration without foreign body of unspecified finger without damage to nail, initial encounter: Secondary | ICD-10-CM | POA: Diagnosis not present

## 2018-10-07 DIAGNOSIS — Z01419 Encounter for gynecological examination (general) (routine) without abnormal findings: Secondary | ICD-10-CM | POA: Diagnosis not present

## 2018-10-07 DIAGNOSIS — Z6825 Body mass index (BMI) 25.0-25.9, adult: Secondary | ICD-10-CM | POA: Diagnosis not present

## 2018-10-07 DIAGNOSIS — Z124 Encounter for screening for malignant neoplasm of cervix: Secondary | ICD-10-CM | POA: Diagnosis not present

## 2018-10-07 DIAGNOSIS — Z1231 Encounter for screening mammogram for malignant neoplasm of breast: Secondary | ICD-10-CM | POA: Diagnosis not present

## 2018-11-27 ENCOUNTER — Other Ambulatory Visit: Payer: Self-pay | Admitting: *Deleted

## 2018-11-27 DIAGNOSIS — Z20822 Contact with and (suspected) exposure to covid-19: Secondary | ICD-10-CM

## 2018-11-28 LAB — NOVEL CORONAVIRUS, NAA: SARS-CoV-2, NAA: NOT DETECTED

## 2018-12-24 ENCOUNTER — Other Ambulatory Visit: Payer: Self-pay

## 2018-12-24 DIAGNOSIS — Z20822 Contact with and (suspected) exposure to covid-19: Secondary | ICD-10-CM

## 2018-12-24 DIAGNOSIS — R6889 Other general symptoms and signs: Secondary | ICD-10-CM | POA: Diagnosis not present

## 2018-12-25 LAB — NOVEL CORONAVIRUS, NAA: SARS-CoV-2, NAA: NOT DETECTED

## 2018-12-27 DIAGNOSIS — M7742 Metatarsalgia, left foot: Secondary | ICD-10-CM | POA: Diagnosis not present

## 2019-01-03 ENCOUNTER — Other Ambulatory Visit: Payer: Self-pay | Admitting: Family Medicine

## 2019-01-03 ENCOUNTER — Ambulatory Visit
Admission: RE | Admit: 2019-01-03 | Discharge: 2019-01-03 | Disposition: A | Discharge status: Home / Self Care | Payer: BC Managed Care – PPO | Source: Ambulatory Visit | Attending: Family Medicine | Admitting: Family Medicine

## 2019-01-03 DIAGNOSIS — M19072 Primary osteoarthritis, left ankle and foot: Secondary | ICD-10-CM | POA: Diagnosis not present

## 2019-01-03 DIAGNOSIS — M7742 Metatarsalgia, left foot: Secondary | ICD-10-CM

## 2019-01-14 DIAGNOSIS — Z85820 Personal history of malignant melanoma of skin: Secondary | ICD-10-CM | POA: Diagnosis not present

## 2019-01-14 DIAGNOSIS — L821 Other seborrheic keratosis: Secondary | ICD-10-CM | POA: Diagnosis not present

## 2019-01-14 DIAGNOSIS — Z85828 Personal history of other malignant neoplasm of skin: Secondary | ICD-10-CM | POA: Diagnosis not present

## 2019-01-14 DIAGNOSIS — D2262 Melanocytic nevi of left upper limb, including shoulder: Secondary | ICD-10-CM | POA: Diagnosis not present

## 2019-01-27 ENCOUNTER — Other Ambulatory Visit: Payer: Self-pay

## 2019-01-27 ENCOUNTER — Ambulatory Visit (INDEPENDENT_AMBULATORY_CARE_PROVIDER_SITE_OTHER): Payer: BC Managed Care – PPO | Admitting: Podiatry

## 2019-01-27 ENCOUNTER — Ambulatory Visit (INDEPENDENT_AMBULATORY_CARE_PROVIDER_SITE_OTHER): Payer: BC Managed Care – PPO

## 2019-01-27 ENCOUNTER — Encounter: Payer: Self-pay | Admitting: Podiatry

## 2019-01-27 ENCOUNTER — Other Ambulatory Visit: Payer: Self-pay | Admitting: Podiatry

## 2019-01-27 VITALS — BP 110/77

## 2019-01-27 DIAGNOSIS — M779 Enthesopathy, unspecified: Secondary | ICD-10-CM

## 2019-01-27 DIAGNOSIS — I959 Hypotension, unspecified: Secondary | ICD-10-CM | POA: Insufficient documentation

## 2019-01-27 DIAGNOSIS — M1990 Unspecified osteoarthritis, unspecified site: Secondary | ICD-10-CM | POA: Insufficient documentation

## 2019-01-27 DIAGNOSIS — M79672 Pain in left foot: Secondary | ICD-10-CM | POA: Diagnosis not present

## 2019-01-27 DIAGNOSIS — Q8781 Alport syndrome: Secondary | ICD-10-CM | POA: Insufficient documentation

## 2019-01-27 DIAGNOSIS — N3281 Overactive bladder: Secondary | ICD-10-CM | POA: Insufficient documentation

## 2019-01-27 DIAGNOSIS — D259 Leiomyoma of uterus, unspecified: Secondary | ICD-10-CM | POA: Insufficient documentation

## 2019-01-27 DIAGNOSIS — N952 Postmenopausal atrophic vaginitis: Secondary | ICD-10-CM | POA: Insufficient documentation

## 2019-01-27 NOTE — Progress Notes (Signed)
Subjective:   Patient ID: Kelsey Fitzpatrick, female   DOB: 63 y.o.   MRN: [id number]   HPI Patient presents stating having a lot of pain in my left forefoot that is been present for a number of months and states that it is gradually come more irritated and making her walk on the outside of her foot.  Does not remember injury and patient does not smoke likes to be active   Review of Systems  All other systems reviewed and are negative.       Objective:  Physical Exam Vitals signs and nursing note reviewed.  Constitutional:      Appearance: She is well-developed.  Pulmonary:     Effort: Pulmonary effort is normal.  Musculoskeletal: Normal range of motion.  Skin:    General: Skin is warm.  Neurological:     Mental Status: She is alert.     Neurovascular status intact muscle strength found to be adequate range of motion within normal limits with patient found to have exquisite discomfort second and third metatarsal phalangeal joint left with inflammation fluid of the joint and pain with palpation.  Patient is found to have good digital perfusion and is well oriented x3     Assessment:  Acute capsulitis of the second third and second MPJs left with pain and mild compensatory lateral pain     Plan:  H&P condition reviewed at today I did sterile forefoot block aspirated the joint getting out a small amount of clear fluid injected each joint with 1/4 cc dexamethasone Kenalog and applied thick plantar pad to reduce pressure against the joint surfaces along with rigid bottom shoes and reappoint to recheck again in 2 weeks  X-rays indicate no signs of stress fracture or arthritis associated with condition

## 2019-02-10 ENCOUNTER — Other Ambulatory Visit: Payer: Self-pay

## 2019-02-10 ENCOUNTER — Encounter: Payer: Self-pay | Admitting: Podiatry

## 2019-02-10 ENCOUNTER — Ambulatory Visit (INDEPENDENT_AMBULATORY_CARE_PROVIDER_SITE_OTHER): Payer: BC Managed Care – PPO | Admitting: Podiatry

## 2019-02-10 DIAGNOSIS — M2042 Other hammer toe(s) (acquired), left foot: Secondary | ICD-10-CM | POA: Diagnosis not present

## 2019-02-10 DIAGNOSIS — M779 Enthesopathy, unspecified: Secondary | ICD-10-CM | POA: Diagnosis not present

## 2019-02-10 NOTE — Progress Notes (Signed)
Subjective:   Patient ID: Margarita Mail, female   DOB: 63 y.o.   MRN: [id number]   HPI Patient states my foot feels better but I am concerned about the possibility for hammertoes and him having a lot of pain on the outside of my left foot   ROS      Objective:  Physical Exam  Neurovascular status intact with inflammation pain of the left fifth MPJ with fluid buildup with digital deformity second and third with elongation moderate discomfort     Assessment:  Hammertoe deformity with improved capsulitis second and third MPJs left with patient noted to have inflammation of the fifth MPJ left     Plan:  H&P reviewed condition and a bit of focus on the outside I did sterile prep and injected the capsule 3 mg Dexasone Kenalog 5 mg Xylocaine and I then discussed the hammertoes and recommended at one point future digital fusion educating her on these and causes.  Reappoint for [place] to recheck his symptoms indicate

## 2019-02-14 ENCOUNTER — Other Ambulatory Visit: Payer: Self-pay

## 2019-02-14 DIAGNOSIS — Z20822 Contact with and (suspected) exposure to covid-19: Secondary | ICD-10-CM

## 2019-02-16 LAB — NOVEL CORONAVIRUS, NAA: SARS-CoV-2, NAA: NOT DETECTED

## 2019-02-26 DIAGNOSIS — H04123 Dry eye syndrome of bilateral lacrimal glands: Secondary | ICD-10-CM | POA: Diagnosis not present

## 2019-02-26 DIAGNOSIS — L906 Striae atrophicae: Secondary | ICD-10-CM | POA: Diagnosis not present

## 2019-04-01 DIAGNOSIS — Z0000 Encounter for general adult medical examination without abnormal findings: Secondary | ICD-10-CM | POA: Diagnosis not present

## 2019-04-02 ENCOUNTER — Other Ambulatory Visit: Payer: Self-pay | Admitting: Family Medicine

## 2019-04-02 DIAGNOSIS — E2839 Other primary ovarian failure: Secondary | ICD-10-CM

## 2019-04-02 DIAGNOSIS — Z1231 Encounter for screening mammogram for malignant neoplasm of breast: Secondary | ICD-10-CM

## 2019-06-12 ENCOUNTER — Telehealth: Payer: Self-pay | Admitting: Podiatry

## 2019-06-12 NOTE — Telephone Encounter (Signed)
Pt called requesting a copy of her medical records and wanted to know how she could go about getting those. I told the pt she would need to fill out and sign a medical records release form. Pt requested I fax the form to her at [phone] and once completed and signed she would fax back to me.

## 2019-09-25 ENCOUNTER — Other Ambulatory Visit: Payer: Self-pay

## 2019-09-25 ENCOUNTER — Ambulatory Visit
Admission: RE | Admit: 2019-09-25 | Discharge: 2019-09-25 | Disposition: A | Discharge status: Home / Self Care | Payer: 59 | Source: Ambulatory Visit | Attending: Family Medicine | Admitting: Family Medicine

## 2019-09-25 DIAGNOSIS — Z1231 Encounter for screening mammogram for malignant neoplasm of breast: Secondary | ICD-10-CM

## 2019-10-27 ENCOUNTER — Other Ambulatory Visit: Payer: Self-pay | Admitting: Nephrology

## 2019-10-27 DIAGNOSIS — N181 Chronic kidney disease, stage 1: Secondary | ICD-10-CM

## 2019-11-03 ENCOUNTER — Ambulatory Visit
Admission: RE | Admit: 2019-11-03 | Discharge: 2019-11-03 | Disposition: A | Discharge status: Home / Self Care | Payer: 59 | Source: Ambulatory Visit | Attending: Nephrology | Admitting: Nephrology

## 2019-11-03 DIAGNOSIS — N181 Chronic kidney disease, stage 1: Secondary | ICD-10-CM

## 2019-11-10 ENCOUNTER — Other Ambulatory Visit: Payer: Self-pay | Admitting: Nephrology

## 2019-11-10 DIAGNOSIS — N281 Cyst of kidney, acquired: Secondary | ICD-10-CM

## 2019-11-20 ENCOUNTER — Ambulatory Visit
Admission: RE | Admit: 2019-11-20 | Discharge: 2019-11-20 | Disposition: A | Discharge status: Home / Self Care | Payer: 59 | Source: Ambulatory Visit | Attending: Nephrology | Admitting: Nephrology

## 2019-11-20 ENCOUNTER — Other Ambulatory Visit: Payer: Self-pay

## 2019-11-20 DIAGNOSIS — N281 Cyst of kidney, acquired: Secondary | ICD-10-CM

## 2019-12-03 ENCOUNTER — Other Ambulatory Visit: Payer: 59

## 2019-12-31 ENCOUNTER — Other Ambulatory Visit: Payer: Self-pay | Admitting: Orthopedic Surgery

## 2019-12-31 DIAGNOSIS — M5416 Radiculopathy, lumbar region: Secondary | ICD-10-CM

## 2020-01-13 ENCOUNTER — Other Ambulatory Visit: Payer: 59

## 2020-01-23 ENCOUNTER — Ambulatory Visit
Admission: RE | Admit: 2020-01-23 | Discharge: 2020-01-23 | Disposition: A | Discharge status: Home / Self Care | Payer: 59 | Source: Ambulatory Visit | Attending: Orthopedic Surgery | Admitting: Orthopedic Surgery

## 2020-01-23 ENCOUNTER — Other Ambulatory Visit: Payer: Self-pay

## 2020-01-23 DIAGNOSIS — M5416 Radiculopathy, lumbar region: Secondary | ICD-10-CM

## 2020-02-03 ENCOUNTER — Other Ambulatory Visit: Payer: Self-pay | Admitting: Orthopedic Surgery

## 2020-02-27 NOTE — Progress Notes (Signed)
[facility] [phone] - [place], [place] [address] [place] [place] [address] Phone: [phone] Fax: [phone]      Your procedure is scheduled on 03/03/20.  Report to [facility] Main Entrance "A" at 06:30 A.M., and check in at the Admitting office. Your surgery is scheduled for 08:30am.  Call this number if you have problems the morning of surgery:  [phone]  Call [phone] if you have any questions prior to your surgery date Monday-Friday 8am-4pm    Remember:  Do not eat after midnight the night before your surgery  You may drink clear liquids until 05:30am the morning of your surgery.   Clear liquids allowed are: Water, Non-Citrus Juices (without pulp), Carbonated Beverages, Clear Tea, Black Coffee Only, and Gatorade Patient Instructions  . The night before surgery:  o No food after midnight. ONLY clear liquids after midnight  . The day of surgery (if you do NOT have diabetes):  o Drink ONE (1) Pre-Surgery Clear Ensure as directed.   o This drink was given to you during your hospital  pre-op appointment visit. o Finish the drink by 5:30am the morning of your surgery. o Nothing else to drink after completing the  Pre-Surgery Clear Ensure.         If you have questions, please contact your surgeon's office.     Take these medicines the morning of surgery with A SIP OF WATER  escitalopram (lexapro) Ketotifen (zaditor)   As of today, STOP taking any Aspirin (unless otherwise instructed by your surgeon) Aleve, Naproxen, Ibuprofen, Motrin, Advil, Goody's, BC's, all herbal medications, fish oil, and all vitamins.                      Do not wear jewelry, make up, or nail polish            Do not wear lotions, powders, perfumes/colognes, or deodorant.            Do not shave 48 hours prior to surgery.              Do not bring valuables to the hospital.            [facility] is not  responsible for any belongings or valuables.  Do NOT Smoke (Tobacco/Vaping) or drink Alcohol 24 hours prior to your procedure If you use a CPAP at night, you may bring all equipment for your overnight stay.   Contacts, glasses, dentures or bridgework may not be worn into surgery.      For patients admitted to the hospital, discharge time will be determined by your treatment team.   Patients discharged the day of surgery will not be allowed to drive home, and someone needs to stay with them for 24 hours.    Special instructions:   [facility]- Preparing For Surgery  Before surgery, you can play an important role. Because skin is not sterile, your skin needs to be as free of germs as possible. You can reduce the number of germs on your skin by washing with CHG (chlorahexidine gluconate) Soap before surgery.  CHG is an antiseptic cleaner which kills germs and bonds with the skin to continue killing germs even after washing.    Oral Hygiene is also important to reduce your risk of infection.  Remember - BRUSH YOUR TEETH THE MORNING OF SURGERY WITH YOUR REGULAR TOOTHPASTE  Please do not use if you have an allergy  to CHG or antibacterial soaps. If your skin becomes reddened/irritated stop using the CHG.  Do not shave (including legs and underarms) for at least 48 hours prior to first CHG shower. It is OK to shave your face.  Please follow these instructions carefully.   1. Shower the NIGHT BEFORE SURGERY and the MORNING OF SURGERY with CHG Soap.   2. If you chose to wash your hair, wash your hair first as usual with your normal shampoo.  3. After you shampoo, rinse your hair and body thoroughly to remove the shampoo.  4. Use CHG as you would any other liquid soap. You can apply CHG directly to the skin and wash gently with a scrungie or a clean washcloth.   5. Apply the CHG Soap to your body ONLY FROM THE NECK DOWN.  Do not use on open wounds or open sores. Avoid contact with your eyes,  ears, mouth and genitals (private parts). Wash Face and genitals (private parts)  with your normal soap.   6. Wash thoroughly, paying special attention to the area where your surgery will be performed.  7. Thoroughly rinse your body with warm water from the neck down.  8. DO NOT shower/wash with your normal soap after using and rinsing off the CHG Soap.  9. Pat yourself dry with a CLEAN TOWEL.  10. Wear CLEAN PAJAMAS to bed the night before surgery  11. Place CLEAN SHEETS on your bed the night of your first shower and DO NOT SLEEP WITH PETS.   Day of Surgery: Wear Clean/Comfortable clothing the morning of surgery Do not apply any deodorants/lotions.   Remember to brush your teeth WITH YOUR REGULAR TOOTHPASTE.   Please read over the following fact sheets that you were given.

## 2020-03-01 ENCOUNTER — Encounter (HOSPITAL_COMMUNITY): Payer: Self-pay

## 2020-03-01 ENCOUNTER — Encounter (HOSPITAL_COMMUNITY)
Admission: RE | Admit: 2020-03-01 | Discharge: 2020-03-01 | Disposition: A | Discharge status: Home / Self Care | Payer: 59 | Source: Ambulatory Visit | Attending: Orthopedic Surgery | Admitting: Orthopedic Surgery

## 2020-03-01 ENCOUNTER — Other Ambulatory Visit: Payer: Self-pay

## 2020-03-01 ENCOUNTER — Other Ambulatory Visit (HOSPITAL_COMMUNITY)
Admission: RE | Admit: 2020-03-01 | Discharge: 2020-03-01 | Disposition: A | Discharge status: Home / Self Care | Payer: 59 | Source: Ambulatory Visit | Attending: Orthopedic Surgery | Admitting: Orthopedic Surgery

## 2020-03-01 DIAGNOSIS — Z01812 Encounter for preprocedural laboratory examination: Secondary | ICD-10-CM | POA: Insufficient documentation

## 2020-03-01 DIAGNOSIS — Z20822 Contact with and (suspected) exposure to covid-19: Secondary | ICD-10-CM | POA: Insufficient documentation

## 2020-03-01 HISTORY — DX: Zoster without complications: B02.9

## 2020-03-01 HISTORY — DX: Depression, unspecified: F32.A

## 2020-03-01 HISTORY — DX: Anxiety disorder, unspecified: F41.9

## 2020-03-01 HISTORY — DX: Sleep apnea, unspecified: G47.30

## 2020-03-01 LAB — URINALYSIS, ROUTINE W REFLEX MICROSCOPIC
Bacteria, UA: NONE SEEN
Bilirubin Urine: NEGATIVE
Glucose, UA: NEGATIVE mg/dL
Ketones, ur: NEGATIVE mg/dL
Leukocytes,Ua: NEGATIVE
Nitrite: NEGATIVE
Protein, ur: NEGATIVE mg/dL
Specific Gravity, Urine: 1.01 (ref 1.005–1.030)
pH: 7 (ref 5.0–8.0)

## 2020-03-01 LAB — CBC WITH DIFFERENTIAL/PLATELET
Abs Immature Granulocytes: 0.02 10*3/uL (ref 0.00–0.07)
Basophils Absolute: 0 10*3/uL (ref 0.0–0.1)
Basophils Relative: 1 %
Eosinophils Absolute: 0.1 10*3/uL (ref 0.0–0.5)
Eosinophils Relative: 2 %
HCT: 43.3 % (ref 36.0–46.0)
Hemoglobin: 13.6 g/dL (ref 12.0–15.0)
Immature Granulocytes: 0 %
Lymphocytes Relative: 22 %
Lymphs Abs: 1.4 10*3/uL (ref 0.7–4.0)
MCH: 30 pg (ref 26.0–34.0)
MCHC: 31.4 g/dL (ref 30.0–36.0)
MCV: 95.6 fL (ref 80.0–100.0)
Monocytes Absolute: 0.8 10*3/uL (ref 0.1–1.0)
Monocytes Relative: 12 %
Neutro Abs: 3.8 10*3/uL (ref 1.7–7.7)
Neutrophils Relative %: 63 %
Platelets: 256 10*3/uL (ref 150–400)
RBC: 4.53 MIL/uL (ref 3.87–5.11)
RDW: 14.6 % (ref 11.5–15.5)
WBC: 6.1 10*3/uL (ref 4.0–10.5)
nRBC: 0 % (ref 0.0–0.2)

## 2020-03-01 LAB — COMPREHENSIVE METABOLIC PANEL
ALT: 19 U/L (ref 0–44)
AST: 22 U/L (ref 15–41)
Albumin: 4 g/dL (ref 3.5–5.0)
Alkaline Phosphatase: 63 U/L (ref 38–126)
Anion gap: 11 (ref 5–15)
BUN: 15 mg/dL (ref 8–23)
CO2: 25 mmol/L (ref 22–32)
Calcium: 9.7 mg/dL (ref 8.9–10.3)
Chloride: 103 mmol/L (ref 98–111)
Creatinine, Ser: 0.88 mg/dL (ref 0.44–1.00)
GFR, Estimated: >60 mL/min (ref >60)
Glucose, Bld: 85 mg/dL (ref 70–99)
Potassium: 3.6 mmol/L (ref 3.5–5.1)
Sodium: 139 mmol/L (ref 135–145)
Total Bilirubin: 0.8 mg/dL (ref 0.3–1.2)
Total Protein: 7.1 g/dL (ref 6.5–8.1)

## 2020-03-01 LAB — SURGICAL PCR SCREEN
MRSA, PCR: NEGATIVE
Staphylococcus aureus: NEGATIVE

## 2020-03-01 LAB — APTT: aPTT: 27 seconds (ref 24–36)

## 2020-03-01 LAB — TYPE AND SCREEN
ABO/RH(D): O POS
Antibody Screen: NEGATIVE

## 2020-03-01 LAB — SARS CORONAVIRUS 2 (TAT 6-24 HRS): SARS Coronavirus 2: NEGATIVE

## 2020-03-01 LAB — PROTIME-INR
INR: 0.9 (ref 0.8–1.2)
Prothrombin Time: 11.8 seconds (ref 11.4–15.2)

## 2020-03-01 NOTE — Progress Notes (Signed)
PCP - Kelton Pillar Cardiologist - denies Nephrologist - Madelon Lips  Chest x-ray -  EKG -  Stress Test - 03/16/16 ECHO - denies Cardiac Cath - denies  Sleep Study - March 2021 [place] requesting CPAP - at night  ERAS Protcol - yes PRE-SURGERY Ensure or G2- ensure given  COVID TEST- 03/01/20   Anesthesia review: yes, records requested  Patient denies shortness of breath, fever, cough and chest pain at PAT appointment   All instructions explained to the patient, with a verbal understanding of the material. Patient agrees to go over the instructions while at home for a better understanding. Patient also instructed to self quarantine after being tested for COVID-19. The opportunity to ask questions was provided.

## 2020-03-03 ENCOUNTER — Encounter (HOSPITAL_COMMUNITY)
Admission: RE | Disposition: A | Discharge status: Home / Self Care | Payer: Self-pay | Source: Home / Self Care | Attending: Orthopedic Surgery

## 2020-03-03 ENCOUNTER — Ambulatory Visit (HOSPITAL_COMMUNITY): Payer: 59 | Admitting: Registered Nurse

## 2020-03-03 ENCOUNTER — Ambulatory Visit (HOSPITAL_COMMUNITY)
Admission: RE | Admit: 2020-03-03 | Discharge: 2020-03-03 | Disposition: A | Discharge status: Home / Self Care | Payer: 59 | Attending: Orthopedic Surgery | Admitting: Orthopedic Surgery

## 2020-03-03 ENCOUNTER — Ambulatory Visit (HOSPITAL_COMMUNITY): Payer: 59

## 2020-03-03 ENCOUNTER — Ambulatory Visit (HOSPITAL_COMMUNITY): Payer: 59 | Admitting: Physician Assistant

## 2020-03-03 ENCOUNTER — Other Ambulatory Visit: Payer: Self-pay

## 2020-03-03 ENCOUNTER — Encounter (HOSPITAL_COMMUNITY): Payer: Self-pay | Admitting: Orthopedic Surgery

## 2020-03-03 DIAGNOSIS — M960 Pseudarthrosis after fusion or arthrodesis: Secondary | ICD-10-CM | POA: Diagnosis present

## 2020-03-03 DIAGNOSIS — Z79899 Other long term (current) drug therapy: Secondary | ICD-10-CM | POA: Insufficient documentation

## 2020-03-03 DIAGNOSIS — Z419 Encounter for procedure for purposes other than remedying health state, unspecified: Secondary | ICD-10-CM

## 2020-03-03 DIAGNOSIS — Z885 Allergy status to narcotic agent status: Secondary | ICD-10-CM | POA: Diagnosis not present

## 2020-03-03 DIAGNOSIS — Y838 Other surgical procedures as the cause of abnormal reaction of the patient, or of later complication, without mention of misadventure at the time of the procedure: Secondary | ICD-10-CM | POA: Diagnosis not present

## 2020-03-03 HISTORY — PX: SACROILIAC JOINT FUSION: SHX6088

## 2020-03-03 SURGERY — SACROILIAC JOINT FUSION
Anesthesia: General | Laterality: Left

## 2020-03-03 MED ORDER — DEXAMETHASONE SODIUM PHOSPHATE 10 MG/ML IJ SOLN
INTRAMUSCULAR | Status: AC
  Filled 2020-03-03: qty 1

## 2020-03-03 MED ORDER — POVIDONE-IODINE 7.5 % EX SOLN
Freq: Once | CUTANEOUS | Status: DC
  Filled 2020-03-03: qty 118

## 2020-03-03 MED ORDER — ROCURONIUM BROMIDE 10 MG/ML (PF) SYRINGE
PREFILLED_SYRINGE | INTRAVENOUS | Status: DC | PRN
  Administered 2020-03-03: 10 mg via INTRAVENOUS
  Administered 2020-03-03: 20 mg via INTRAVENOUS
  Administered 2020-03-03: 60 mg via INTRAVENOUS

## 2020-03-03 MED ORDER — MIDAZOLAM HCL 2 MG/2ML IJ SOLN
INTRAMUSCULAR | Status: AC
  Filled 2020-03-03: qty 2

## 2020-03-03 MED ORDER — CHLORHEXIDINE GLUCONATE 0.12 % MT SOLN: 15.0000 mL | Freq: Once | OROMUCOSAL | Status: AC

## 2020-03-03 MED ORDER — LIDOCAINE 2% (20 MG/ML) 5 ML SYRINGE
INTRAMUSCULAR | Status: AC
  Filled 2020-03-03: qty 5

## 2020-03-03 MED ORDER — BUPIVACAINE LIPOSOME 1.3 % IJ SUSP
20.0000 mL | Freq: Once | INTRAMUSCULAR | Status: DC
  Filled 2020-03-03: qty 20

## 2020-03-03 MED ORDER — 0.9 % SODIUM CHLORIDE (POUR BTL) OPTIME
TOPICAL | Status: DC | PRN
  Administered 2020-03-03: 1000 mL

## 2020-03-03 MED ORDER — ACETAMINOPHEN 10 MG/ML IV SOLN
INTRAVENOUS | Status: AC
  Administered 2020-03-03: 1000 mg via INTRAVENOUS
  Filled 2020-03-03: qty 100

## 2020-03-03 MED ORDER — MIDAZOLAM HCL 5 MG/5ML IJ SOLN
INTRAMUSCULAR | Status: DC | PRN
  Administered 2020-03-03: 2 mg via INTRAVENOUS

## 2020-03-03 MED ORDER — ROCURONIUM BROMIDE 10 MG/ML (PF) SYRINGE
PREFILLED_SYRINGE | INTRAVENOUS | Status: AC
  Filled 2020-03-03: qty 10

## 2020-03-03 MED ORDER — ONDANSETRON HCL 4 MG/2ML IJ SOLN
INTRAMUSCULAR | Status: AC
  Filled 2020-03-03: qty 2

## 2020-03-03 MED ORDER — HYDROMORPHONE HCL 1 MG/ML IJ SOLN: 0.2500 mg | INTRAMUSCULAR | Status: DC | PRN

## 2020-03-03 MED ORDER — OXYCODONE-ACETAMINOPHEN 5-325 MG PO TABS
1.0000 | ORAL_TABLET | ORAL | 0 refills | Status: AC | PRN
Start: 2020-03-03 — End: 2020-03-10

## 2020-03-03 MED ORDER — CEFAZOLIN SODIUM-DEXTROSE 2-4 GM/100ML-% IV SOLN
INTRAVENOUS | Status: AC
  Filled 2020-03-03: qty 100

## 2020-03-03 MED ORDER — CEFAZOLIN SODIUM-DEXTROSE 2-4 GM/100ML-% IV SOLN
2.0000 g | INTRAVENOUS | Status: AC
  Administered 2020-03-03: 2 g via INTRAVENOUS

## 2020-03-03 MED ORDER — CHLORHEXIDINE GLUCONATE 0.12 % MT SOLN
OROMUCOSAL | Status: AC
  Administered 2020-03-03: 15 mL via OROMUCOSAL
  Filled 2020-03-03: qty 15

## 2020-03-03 MED ORDER — EPHEDRINE SULFATE-NACL 50-0.9 MG/10ML-% IV SOSY
PREFILLED_SYRINGE | INTRAVENOUS | Status: DC | PRN
  Administered 2020-03-03 (×3): 10 mg via INTRAVENOUS
  Administered 2020-03-03: 15 mg via INTRAVENOUS

## 2020-03-03 MED ORDER — MEPERIDINE HCL 25 MG/ML IJ SOLN: 6.2500 mg | INTRAMUSCULAR | Status: DC | PRN

## 2020-03-03 MED ORDER — SUGAMMADEX SODIUM 200 MG/2ML IV SOLN
INTRAVENOUS | Status: DC | PRN
  Administered 2020-03-03 (×2): 100 mg via INTRAVENOUS

## 2020-03-03 MED ORDER — ONDANSETRON HCL 4 MG PO TABS: 4.0000 mg | ORAL_TABLET | Freq: Three times a day (TID) | ORAL | 0 refills | Status: AC | PRN

## 2020-03-03 MED ORDER — BUPIVACAINE-EPINEPHRINE (PF) 0.25% -1:200000 IJ SOLN
INTRAMUSCULAR | Status: DC | PRN
  Administered 2020-03-03: 7 mL via PERINEURAL
  Administered 2020-03-03: 23 mL via PERINEURAL

## 2020-03-03 MED ORDER — ACETAMINOPHEN 10 MG/ML IV SOLN: 1000.0000 mg | Freq: Once | INTRAVENOUS | Status: DC | PRN

## 2020-03-03 MED ORDER — FENTANYL CITRATE (PF) 250 MCG/5ML IJ SOLN
INTRAMUSCULAR | Status: AC
  Filled 2020-03-03: qty 5

## 2020-03-03 MED ORDER — ACETAMINOPHEN 160 MG/5ML PO SOLN: 325.0000 mg | Freq: Once | ORAL | Status: DC | PRN

## 2020-03-03 MED ORDER — BUPIVACAINE LIPOSOME 1.3 % IJ SUSP
INTRAMUSCULAR | Status: DC | PRN
  Administered 2020-03-03: 20 mL

## 2020-03-03 MED ORDER — METHOCARBAMOL 500 MG PO TABS: 500.0000 mg | ORAL_TABLET | Freq: Four times a day (QID) | ORAL | 0 refills | Status: DC | PRN

## 2020-03-03 MED ORDER — ORAL CARE MOUTH RINSE: 15.0000 mL | Freq: Once | OROMUCOSAL | Status: AC

## 2020-03-03 MED ORDER — DEXAMETHASONE SODIUM PHOSPHATE 10 MG/ML IJ SOLN
INTRAMUSCULAR | Status: DC | PRN
  Administered 2020-03-03: 10 mg via INTRAVENOUS

## 2020-03-03 MED ORDER — BUPIVACAINE-EPINEPHRINE (PF) 0.25% -1:200000 IJ SOLN
INTRAMUSCULAR | Status: AC
  Filled 2020-03-03: qty 10

## 2020-03-03 MED ORDER — LACTATED RINGERS IV SOLN: INTRAVENOUS | Status: DC

## 2020-03-03 MED ORDER — PROPOFOL 10 MG/ML IV BOLUS
INTRAVENOUS | Status: AC
  Filled 2020-03-03: qty 20

## 2020-03-03 MED ORDER — ONDANSETRON HCL 4 MG/2ML IJ SOLN
INTRAMUSCULAR | Status: DC | PRN
  Administered 2020-03-03: 4 mg via INTRAVENOUS

## 2020-03-03 MED ORDER — PROPOFOL 10 MG/ML IV BOLUS
INTRAVENOUS | Status: DC | PRN
  Administered 2020-03-03: 130 mg via INTRAVENOUS

## 2020-03-03 MED ORDER — LIDOCAINE 2% (20 MG/ML) 5 ML SYRINGE
INTRAMUSCULAR | Status: DC | PRN
  Administered 2020-03-03: 40 mg via INTRAVENOUS

## 2020-03-03 MED ORDER — FENTANYL CITRATE (PF) 250 MCG/5ML IJ SOLN
INTRAMUSCULAR | Status: DC | PRN
  Administered 2020-03-03: 75 ug via INTRAVENOUS
  Administered 2020-03-03: 25 ug via INTRAVENOUS
  Administered 2020-03-03: 75 ug via INTRAVENOUS

## 2020-03-03 MED ORDER — ACETAMINOPHEN 325 MG PO TABS: 325.0000 mg | ORAL_TABLET | Freq: Once | ORAL | Status: DC | PRN

## 2020-03-03 MED ORDER — AMISULPRIDE (ANTIEMETIC) 5 MG/2ML IV SOLN: 10.0000 mg | Freq: Once | INTRAVENOUS | Status: DC | PRN

## 2020-03-03 MED ORDER — EPHEDRINE 5 MG/ML INJ
INTRAVENOUS | Status: AC
  Filled 2020-03-03: qty 10

## 2020-03-03 SURGICAL SUPPLY — 62 items
APL SKNCLS STERI-STRIP NONHPOA (GAUZE/BANDAGES/DRESSINGS) ×1
BENZOIN TINCTURE PRP APPL 2/3 (GAUZE/BANDAGES/DRESSINGS) ×2 IMPLANT
BLADE CLIPPER SURG (BLADE) ×2 IMPLANT
BLADE SURG 10 STRL SS (BLADE) ×2 IMPLANT
BONE VIVIGEN FORMABLE 5.4CC (Bone Implant) ×2 IMPLANT
CANISTER SUCT 3000ML PPV (MISCELLANEOUS) ×2 IMPLANT
COVER SURGICAL LIGHT HANDLE (MISCELLANEOUS) ×4 IMPLANT
COVER WAND RF STERILE (DRAPES) ×2 IMPLANT
DRAPE C-ARM 42X72 X-RAY (DRAPES) ×2 IMPLANT
DRAPE C-ARMOR (DRAPES) ×2 IMPLANT
DRAPE INCISE IOBAN 66X45 STRL (DRAPES) ×2 IMPLANT
DRAPE POUCH INSTRU U-SHP 10X18 (DRAPES) ×2 IMPLANT
DRAPE SURG 17X23 STRL (DRAPES) ×6 IMPLANT
DURAPREP 26ML APPLICATOR (WOUND CARE) ×2 IMPLANT
ELECT CAUTERY BLADE 6.4 (BLADE) ×2 IMPLANT
ELECT REM PT RETURN 9FT ADLT (ELECTROSURGICAL) ×2
ELECTRODE REM PT RTRN 9FT ADLT (ELECTROSURGICAL) ×1 IMPLANT
GAUZE 4X4 16PLY RFD (DISPOSABLE) ×2 IMPLANT
GAUZE SPONGE 4X4 12PLY STRL (GAUZE/BANDAGES/DRESSINGS) ×2 IMPLANT
GLOVE BIO SURGEON STRL SZ7 (GLOVE) ×2 IMPLANT
GLOVE BIO SURGEON STRL SZ8 (GLOVE) ×2 IMPLANT
GLOVE BIOGEL PI IND STRL 7.0 (GLOVE) ×1 IMPLANT
GLOVE BIOGEL PI IND STRL 8 (GLOVE) ×1 IMPLANT
GLOVE BIOGEL PI INDICATOR 7.0 (GLOVE) ×1
GLOVE BIOGEL PI INDICATOR 8 (GLOVE) ×1
GOWN STRL REUS W/ TWL LRG LVL3 (GOWN DISPOSABLE) ×2 IMPLANT
GOWN STRL REUS W/ TWL XL LVL3 (GOWN DISPOSABLE) ×1 IMPLANT
GOWN STRL REUS W/TWL LRG LVL3 (GOWN DISPOSABLE) ×4
GOWN STRL REUS W/TWL XL LVL3 (GOWN DISPOSABLE) ×2
GRAFT BNE MATRIX VG FRMBL MD 5 (Bone Implant) IMPLANT
K-WIRE 2.4X300 BLUNT TIP (WIRE) ×2
KIT BASIN OR (CUSTOM PROCEDURE TRAY) ×2 IMPLANT
KIT TURNOVER KIT B (KITS) ×2 IMPLANT
KWIRE 2.4X300 BLUNT TIP (WIRE) IMPLANT
MANIFOLD NEPTUNE II (INSTRUMENTS) ×2 IMPLANT
NDL HYPO 25GX1X1/2 BEV (NEEDLE) ×1 IMPLANT
NEEDLE 22X1 1/2 (OR ONLY) (NEEDLE) ×2 IMPLANT
NEEDLE HYPO 25GX1X1/2 BEV (NEEDLE) ×2 IMPLANT
NS IRRIG 1000ML POUR BTL (IV SOLUTION) ×2 IMPLANT
PACK UNIVERSAL I (CUSTOM PROCEDURE TRAY) ×2 IMPLANT
PAD ARMBOARD 7.5X6 YLW CONV (MISCELLANEOUS) ×4 IMPLANT
PENCIL BUTTON HOLSTER BLD 10FT (ELECTRODE) ×2 IMPLANT
SCREW  SI-LOK 12X50MM (Screw) ×2 IMPLANT
SCREW SI-LOK 12MM HA SL 40MM (Screw) ×1 IMPLANT
SCREW SI-LOK 12MM HA SL 45MM (Screw) ×1 IMPLANT
SCREW SI-LOK 12X50MM (Screw) IMPLANT
SPONGE LAP 18X18 RF (DISPOSABLE) ×2 IMPLANT
STAPLER VISISTAT 35W (STAPLE) ×2 IMPLANT
STRIP CLOSURE SKIN 1/2X4 (GAUZE/BANDAGES/DRESSINGS) ×2 IMPLANT
SUT MNCRL AB 4-0 PS2 18 (SUTURE) ×2 IMPLANT
SUT VIC AB 0 CT1 18XCR BRD 8 (SUTURE) IMPLANT
SUT VIC AB 0 CT1 8-18 (SUTURE) ×2
SUT VIC AB 1 CT1 18XCR BRD 8 (SUTURE) ×1 IMPLANT
SUT VIC AB 1 CT1 8-18 (SUTURE) ×2
SUT VIC AB 2-0 CT2 18 VCP726D (SUTURE) ×2 IMPLANT
SYR BULB IRRIG 60ML STRL (SYRINGE) ×4 IMPLANT
SYR CONTROL 10ML LL (SYRINGE) ×2 IMPLANT
TOWEL GREEN STERILE (TOWEL DISPOSABLE) ×4 IMPLANT
TOWEL GREEN STERILE FF (TOWEL DISPOSABLE) ×2 IMPLANT
TUBE CONNECTING 12X1/4 (SUCTIONS) ×2 IMPLANT
WATER STERILE IRR 1000ML POUR (IV SOLUTION) ×2 IMPLANT
YANKAUER SUCT BULB TIP NO VENT (SUCTIONS) ×2 IMPLANT

## 2020-03-03 NOTE — OR Nursing (Signed)
Pt is awake,alert and oriented. Pt is in NAD at this time. Pt and family verbalized understanding of poc and discharge instructions. instructions given to family and reviewed prior to discharge.Pt is ambulatory with assist due to partial weight bearing .  Pt taken to front lobby and placed in car with family.

## 2020-03-03 NOTE — Anesthesia Postprocedure Evaluation (Signed)
Anesthesia Post Note  Patient: Molli Gethers  Procedure(s) Performed: REVISION LEFT SACROILIAC JOINT FUSION (Left )     Patient location during evaluation: PACU Anesthesia Type: General Level of consciousness: awake and alert Pain management: pain level controlled Vital Signs Assessment: post-procedure vital signs reviewed and stable Respiratory status: spontaneous breathing, nonlabored ventilation, respiratory function stable and patient connected to nasal cannula oxygen Cardiovascular status: blood pressure returned to baseline and stable Postop Assessment: no apparent nausea or vomiting Anesthetic complications: no   No complications documented.  Last Vitals:  Vitals:   03/03/20 1200 03/03/20 1215  BP: 127/72   Pulse: 77 72  Resp: 13 16  Temp:  36.9 C  SpO2: 100% 100%    Last Pain:  Vitals:   03/03/20 1215  TempSrc:   PainSc: Mal[place] Veronica Fretz

## 2020-03-03 NOTE — Anesthesia Preprocedure Evaluation (Addendum)
Anesthesia Evaluation  Patient identified by MRN, date of birth, ID band Patient awake    Reviewed: Allergy & Precautions, NPO status , Patient's Chart, lab work & pertinent test results  History of Anesthesia Complications (+) PONV and history of anesthetic complications  Airway Mallampati: III   Neck ROM: Full  Mouth opening: Limited Mouth Opening  Dental  (+) Teeth Intact, Dental Advisory Given   Pulmonary sleep apnea ,    breath sounds clear to auscultation       Cardiovascular negative cardio ROS   Rhythm:Regular Rate:Normal     Neuro/Psych PSYCHIATRIC DISORDERS Anxiety Depression negative neurological ROS     GI/Hepatic Neg liver ROS, GERD  Medicated,  Endo/Other  negative endocrine ROS  Renal/GU Renal disease     Musculoskeletal  (+) Arthritis ,   Abdominal Normal abdominal exam  (+)   Peds  Hematology negative hematology ROS (+)   Anesthesia Other Findings   Reproductive/Obstetrics                            Anesthesia Physical Anesthesia Plan  ASA: II  Anesthesia Plan: General   Post-op Pain Management:    Induction: Intravenous  PONV Risk Score and Plan: 4 or greater and Ondansetron, Dexamethasone, Midazolam and Scopolamine patch - Pre-op  Airway Management Planned: Oral ETT  Additional Equipment: None  Intra-op Plan:   Post-operative Plan: Extubation in OR  Informed Consent: I have reviewed the patients History and Physical, chart, labs and discussed the procedure including the risks, benefits and alternatives for the proposed anesthesia with the patient or authorized representative who has indicated his/her understanding and acceptance.       Plan Discussed with: CRNA  Anesthesia Plan Comments:        Anesthesia Quick Evaluation

## 2020-03-03 NOTE — Anesthesia Procedure Notes (Signed)
Procedure Name: Intubation Date/Time: 03/03/2020 8:49 AM Performed by: Trinna Post., CRNA Pre-anesthesia Checklist: Patient identified, Emergency Drugs available, Suction available, Patient being monitored and Timeout performed Patient Re-evaluated:Patient Re-evaluated prior to induction Oxygen Delivery Method: Circle system utilized Preoxygenation: Pre-oxygenation with 100% oxygen Induction Type: IV induction Ventilation: Mask ventilation without difficulty Laryngoscope Size: Mac and 3 Grade View: Grade I Tube type: Oral Tube size: 7.0 mm Number of attempts: 1 Airway Equipment and Method: Stylet Placement Confirmation: ETT inserted through vocal cords under direct vision,  positive ETCO2 and breath sounds checked- equal and bilateral Secured at: 22 cm Tube secured with: Tape Dental Injury: Teeth and Oropharynx as per pre-operative assessment

## 2020-03-03 NOTE — Transfer of Care (Signed)
Immediate Anesthesia Transfer of Care Note  Patient: Kelsey Fitzpatrick  Procedure(s) Performed: REVISION LEFT SACROILIAC JOINT FUSION (Left )  Patient Location: PACU  Anesthesia Type:General  Level of Consciousness: awake, alert  and oriented  Airway & Oxygen Therapy: Patient Spontanous Breathing and Patient connected to nasal cannula oxygen  Post-op Assessment: Report given to RN and Post -op Vital signs reviewed and stable  Post vital signs: Reviewed and stable  Last Vitals:  Vitals Value Taken Time  BP    Temp    Pulse    Resp    SpO2      Last Pain:  Vitals:   03/03/20 0720  TempSrc: Temporal  PainSc:       Patients Stated Pain Goal: 2 (28/78/67 6720)  Complications: No complications documented.

## 2020-03-03 NOTE — H&P (Signed)
PREOPERATIVE H&P  Chief Complaint: Left low back pain  HPI: Kelsey Fitzpatrick is a 64 y.o. female who presents with ongoing pain in the left back. Patient is s/p an SI fusion in 2013, and recently did sustain a fall. Pain is c/w an SI nonunion.  Patient has failed multiple forms of conservative care and continues to have pain (see office notes for additional details regarding the patient's full course of treatment)  Past Medical History:  Diagnosis Date  . Abnormal Pap smear 2006   ASCUS  . Anxiety   . Arthritis    OA   . Back pain   . Cancer ([place])    2012 LT ARM MELANOMA   . Chronic kidney disease    Blood in urine  . Depression   . Encounter for insertion of mirena IUD 2008  . Fibroids   . Osteopenia   . PONV (postoperative nausea and vomiting)   . Proteinuria    ever since childhood  . Shingles   . SI (sacroiliac) joint dysfunction   . Sleep apnea    Past Surgical History:  Procedure Laterality Date  . [place]   . COLONOSCOPY    . MULTIPLE TOOTH EXTRACTIONS    . SACROILIAC JOINT FUSION  02/14/2012   Procedure: SACROILIAC JOINT FUSION;  Surgeon: Sinclair Ship, MD;  Location: [facility];  Service: Orthopedics;  Laterality: Left;  Left sacroiliac joint fusion  . WISDOM TOOTH EXTRACTION     Social History   Socioeconomic History  . Marital status: Single    Spouse name: Not on file  . Number of children: Not on file  . Years of education: Not on file  . Highest education level: Not on file  Occupational History  . Not on file  Tobacco Use  . Smoking status: Never Smoker  . Smokeless tobacco: Never Used  Vaping Use  . Vaping Use: Never used  Substance and Sexual Activity  . Alcohol use: Yes    Comment: occasionally maybe 2x a week  . Drug use: No  . Sexual activity: Yes    Birth control/protection: None  Other Topics Concern  . Not on file  Social History Narrative  . Not on file   Social Determinants of Health    Financial Resource Strain:   . Difficulty of Paying Living Expenses: Not on file  Food Insecurity:   . Worried About Charity fundraiser in the Last Year: Not on file  . Ran Out of Food in the Last Year: Not on file  Transportation Needs:   . Lack of Transportation (Medical): Not on file  . Lack of Transportation (Non-Medical): Not on file  Physical Activity:   . Days of Exercise per Week: Not on file  . Minutes of Exercise per Session: Not on file  Stress:   . Feeling of Stress : Not on file  Social Connections:   . Frequency of Communication with Friends and Family: Not on file  . Frequency of Social Gatherings with Friends and Family: Not on file  . Attends Religious Services: Not on file  . Active Member of Clubs or Organizations: Not on file  . Attends Archivist Meetings: Not on file  . Marital Status: Not on file   Family History  Problem Relation Age of Onset  . Kidney disease Father   . Dementia Mother   . Stroke Mother   . Rheum arthritis Mother  Allergies  Allergen Reactions  . Codeine Nausea Only    Dizziness  . Hydrocodone Nausea And Vomiting   Prior to Admission medications   Medication Sig Start Date End Date Taking? Authorizing Provider  ALPRAZolam Duanne Moron) 0.25 MG tablet Take 0.25 mg by mouth at bedtime as needed for sleep.    Yes [provider]  CALCIUM CITRATE-VITAMIN D PO Take 1 tablet by mouth daily.    Yes [provider]  escitalopram (LEXAPRO) 10 MG tablet Take 10 mg by mouth daily.  06/07/15  Yes [provider]  ketotifen (ZADITOR) 0.025 % ophthalmic solution Place 1 drop into both eyes 2 (two) times daily as needed (allergies).   Yes [provider]  Multiple Vitamin (MULTIVITAMIN WITH MINERALS) TABS Take 1 tablet by mouth daily.   Yes [provider]  calcium acetate (PHOSLO) 667 MG capsule Take by mouth. Patient not taking: Reported on 02/25/2020    [provider]  Melatonin 3  MG TABS melatonin 3 mg tablet   1 tablet as needed by oral route. Patient not taking: Reported on 02/25/2020    [provider]  Multiple Vitamins-Minerals (ICAPS LUTEIN & ZEAXANTHIN PO) Take 1 tablet by mouth daily. Patient not taking: Reported on 02/25/2020    [provider]  omeprazole (PRILOSEC) 40 MG capsule omeprazole 40 mg capsule,delayed release  take 1 capsule by mouth once daily Patient not taking: Reported on 02/25/2020    [provider]  oxybutynin (DITROPAN XL) 10 MG 24 hr tablet Take 1 tablet (10 mg total) by mouth at bedtime. Patient not taking: Reported on 02/25/2020 01/13/14   Margarita Mail, PA-C     All other systems have been reviewed and were otherwise negative with the exception of those mentioned in the HPI and as above.  Physical Exam: Vitals:   03/03/20 0720  BP: (!) 142/89  Pulse: 68  Resp: 18  Temp: 97.9 F (36.6 C)  SpO2: 100%    Body mass index is 25.37 kg/m.  General: Alert, no acute distress Cardiovascular: No pedal edema Respiratory: No cyanosis, no use of accessory musculature Skin: No lesions in the area of chief complaint Neurologic: Sensation intact distally Psychiatric: Patient is competent for consent with normal mood and affect Lymphatic: No axillary or cervical lymphadenopathy    Assessment/Plan: LEFT SACROILLIAC JOINT NONUNION Plan for Procedure(s): [place], MD 03/03/2020 8:18 AM
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
## 2020-03-04 ENCOUNTER — Encounter (HOSPITAL_COMMUNITY): Payer: Self-pay | Admitting: Orthopedic Surgery

## 2020-05-28 DIAGNOSIS — M79676 Pain in unspecified toe(s): Secondary | ICD-10-CM

## 2020-08-24 ENCOUNTER — Other Ambulatory Visit: Payer: Self-pay | Admitting: Family Medicine

## 2020-08-24 DIAGNOSIS — Z1231 Encounter for screening mammogram for malignant neoplasm of breast: Secondary | ICD-10-CM

## 2020-10-22 ENCOUNTER — Other Ambulatory Visit: Payer: Self-pay

## 2020-10-22 ENCOUNTER — Ambulatory Visit
Admission: RE | Admit: 2020-10-22 | Discharge: 2020-10-22 | Disposition: A | Discharge status: Home / Self Care | Payer: 59 | Source: Ambulatory Visit | Attending: Family Medicine | Admitting: Family Medicine

## 2020-10-22 DIAGNOSIS — Z1231 Encounter for screening mammogram for malignant neoplasm of breast: Secondary | ICD-10-CM

## 2021-01-09 IMAGING — CR DG FOOT COMPLETE 3+V*L*
3 series · 3 of 3 positions shown · non-contrast
Comparison: None.

CLINICAL DATA: Pain in the first and second metatarsal region of
the left foot. No known injury. History of bunion surgery.

EXAM:
LEFT FOOT - COMPLETE 3+ VIEW

[t foot ap left]
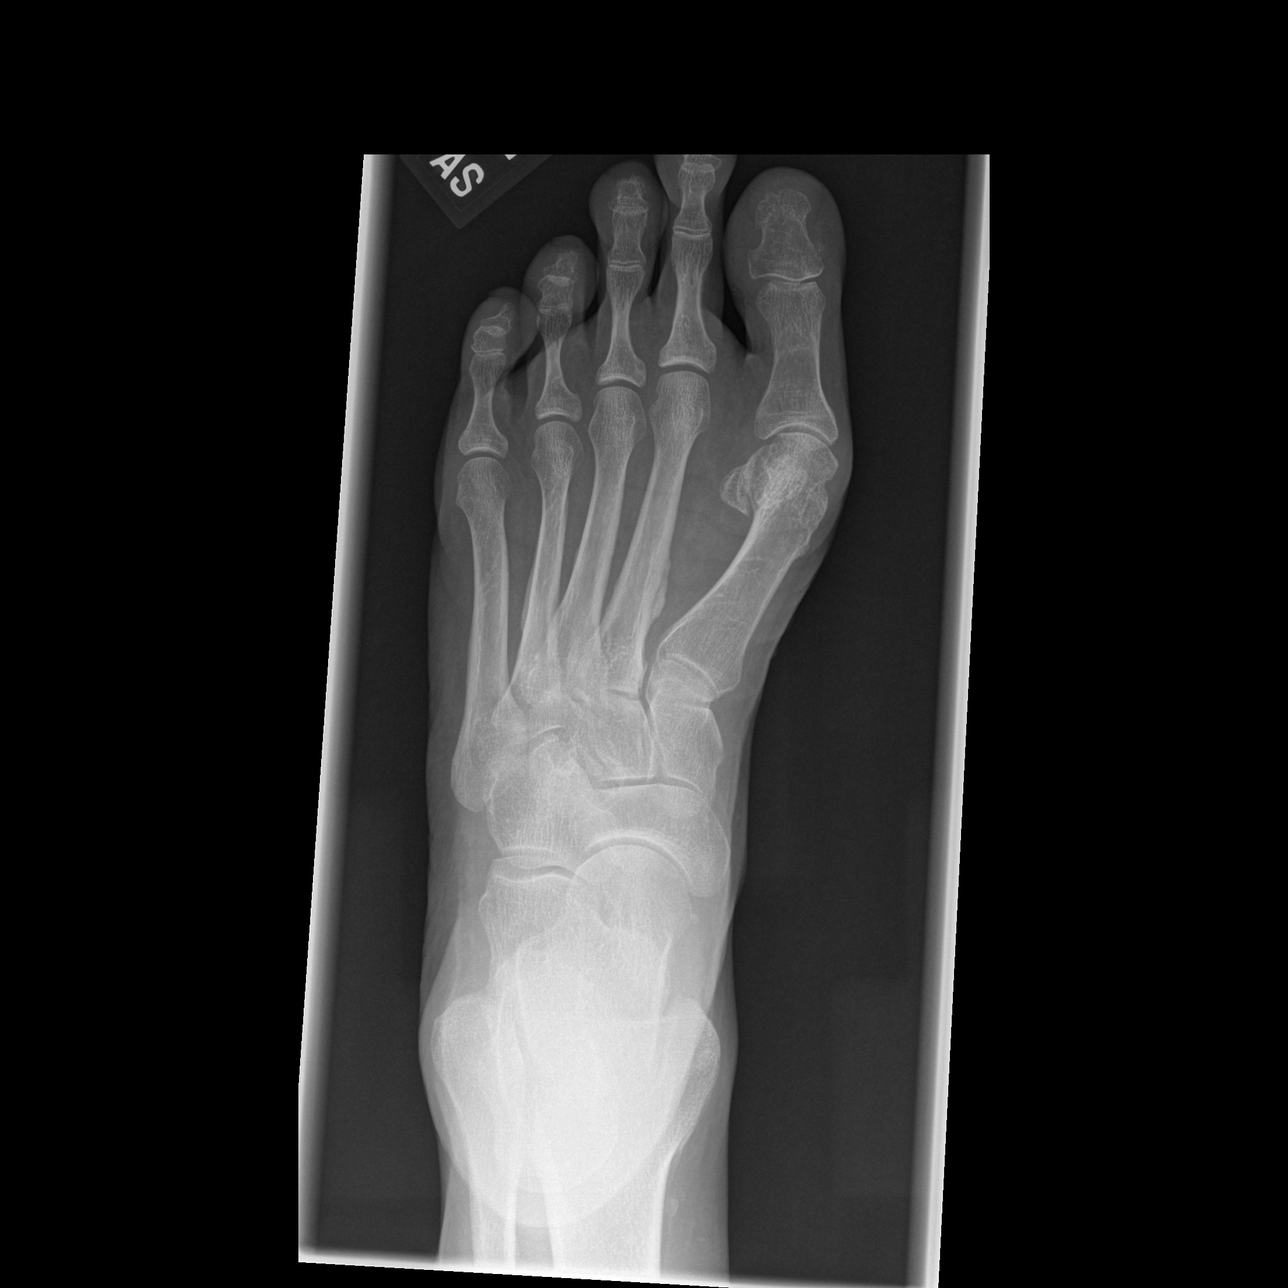

[t foot oblique left]
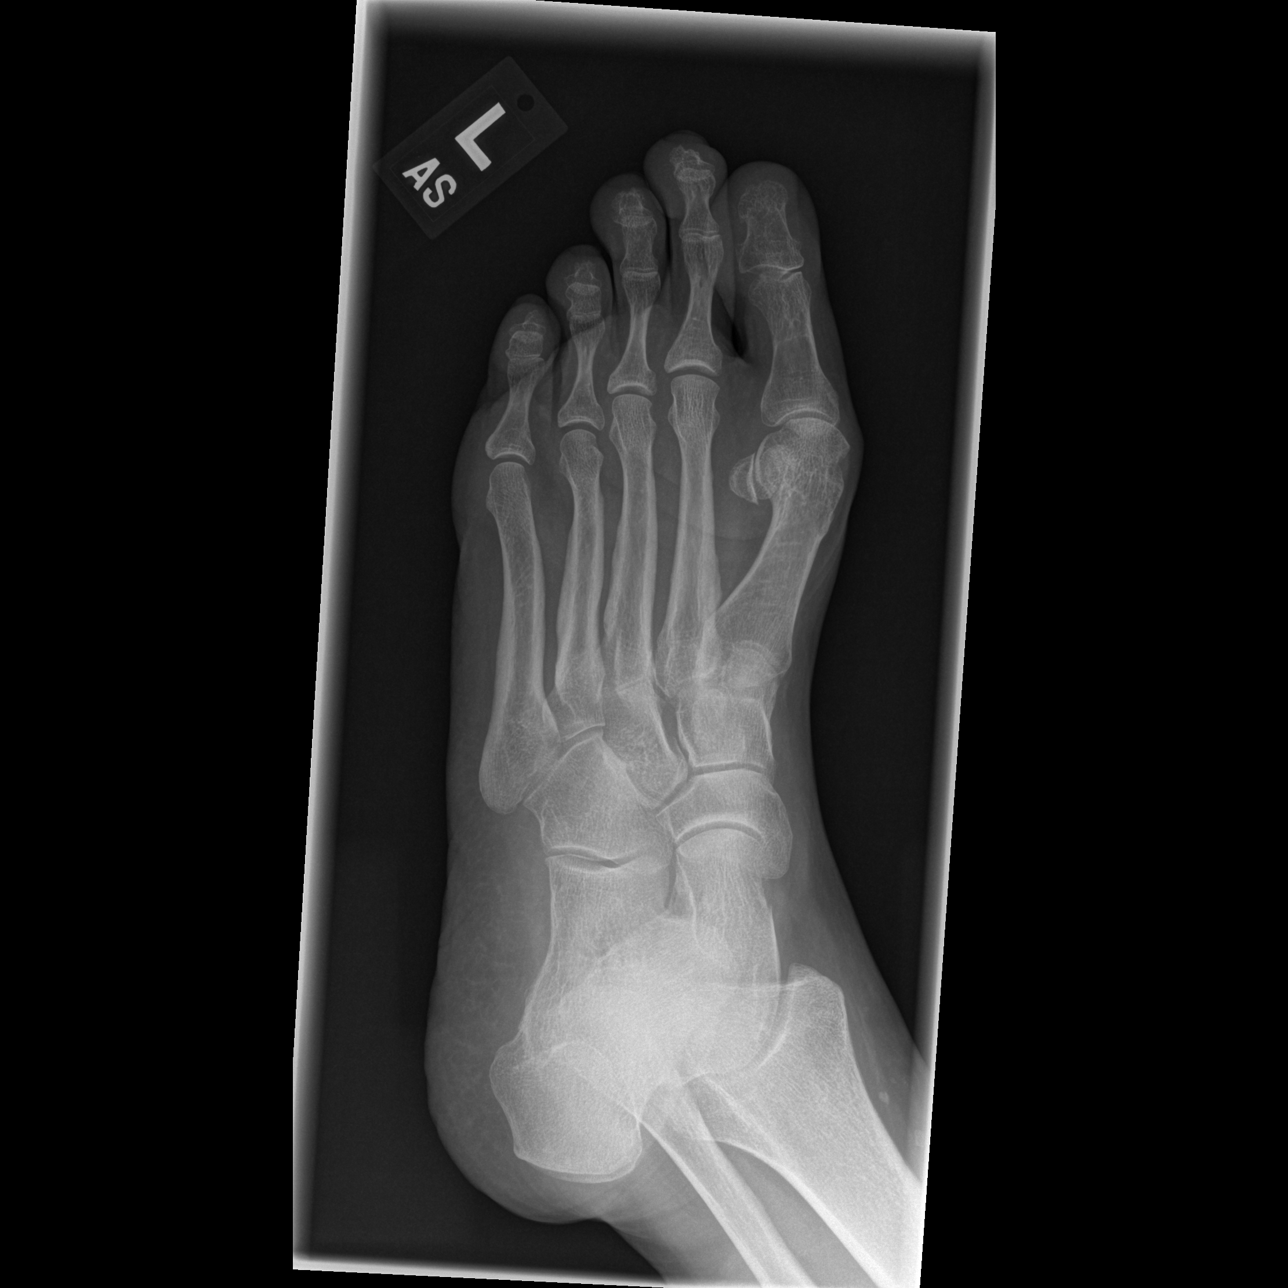

[t foot lat left]
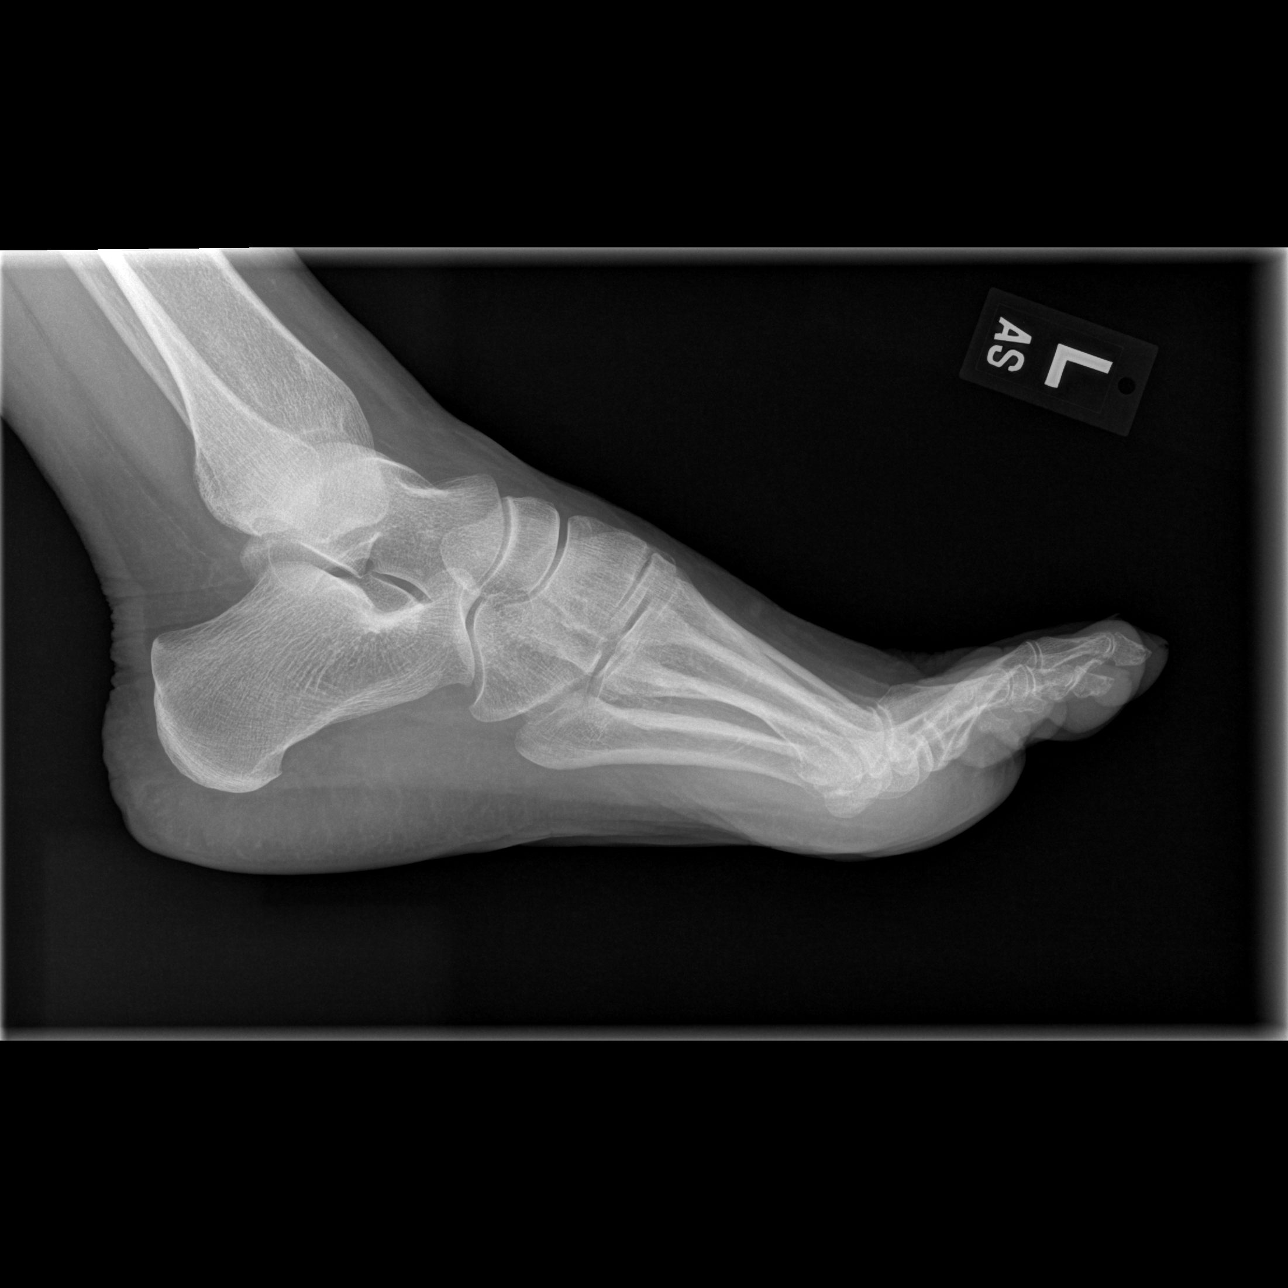

[3 of 3 positions shown; findings below may reference images not displayed]

FINDINGS: No fracture or bone lesion.

Mild asymmetric joint space narrowing of the first
metatarsophalangeal joint mild hallux valgus. Irregularity along the
medial margin of the first metatarsal head is consistent with the
history of prior surgery. Remaining joints normally spaced and
aligned.

Soft tissues are unremarkable.
IMPRESSION: 1. No fracture or acute finding.
2. Mild first metatarsophalangeal joint osteoarthritis and hallux
valgus. Previous changes from a bunionectomy. No other
abnormalities.

## 2021-04-06 ENCOUNTER — Other Ambulatory Visit: Payer: Self-pay | Admitting: Orthopedic Surgery

## 2021-04-18 ENCOUNTER — Other Ambulatory Visit: Payer: Self-pay

## 2021-04-18 DIAGNOSIS — Z820 Family history of epilepsy and other diseases of the nervous system: Secondary | ICD-10-CM | POA: Insufficient documentation

## 2021-04-18 DIAGNOSIS — G479 Sleep disorder, unspecified: Secondary | ICD-10-CM | POA: Insufficient documentation

## 2021-04-18 DIAGNOSIS — M130 Polyarthritis, unspecified: Secondary | ICD-10-CM | POA: Insufficient documentation

## 2021-04-18 DIAGNOSIS — L719 Rosacea, unspecified: Secondary | ICD-10-CM | POA: Insufficient documentation

## 2021-04-18 DIAGNOSIS — G4733 Obstructive sleep apnea (adult) (pediatric): Secondary | ICD-10-CM | POA: Insufficient documentation

## 2021-04-18 DIAGNOSIS — F39 Unspecified mood [affective] disorder: Secondary | ICD-10-CM | POA: Insufficient documentation

## 2021-04-18 DIAGNOSIS — I7300 Raynaud's syndrome without gangrene: Secondary | ICD-10-CM | POA: Insufficient documentation

## 2021-04-18 DIAGNOSIS — Z780 Asymptomatic menopausal state: Secondary | ICD-10-CM | POA: Insufficient documentation

## 2021-04-18 DIAGNOSIS — K2970 Gastritis, unspecified, without bleeding: Secondary | ICD-10-CM | POA: Insufficient documentation

## 2021-04-18 DIAGNOSIS — D049 Carcinoma in situ of skin, unspecified: Secondary | ICD-10-CM | POA: Insufficient documentation

## 2021-04-18 NOTE — Progress Notes (Signed)
[facility] [place]  ASSESSMENT / PLAN: Kelsey Fitzpatrick is a 66 y.o. female with plans to undergo L5/S1 anterior lumbar interbody fusion on 05/12/21 with Dr. Lynann Bologna. I have reviewed the patient's imaging and feel it is safe to approach this anteriorly. I discussed the specific risks associated with spine exposure including vascular injury, urethral injury, bowel injury, nervous injury. I explained that if severe vascular injury was encountered we may need to abandon the spinal procedure to safely repair the injury. I explained the typical postoperative course for anterior exposure of the spine, including small risk of postoperative ileus. The patient was understanding and wished to proceed.  CHIEF COMPLAINT: back pain  HISTORY OF PRESENT ILLNESS: Kelsey Fitzpatrick is a 66 y.o. female referred to clinic for discussion of the exposure of anterior lumbar interbody fusion.  Patient has had no prior abdominal surgeries.  She reports no prior pelvic or abdominal radiation therapy.  Much of the visit was spent discussing the operative conduct and the risks of this approach.  Past Medical History:  Diagnosis Date   Abnormal Pap smear 2006   ASCUS   Anxiety    Arthritis    OA    Back pain    Cancer ([place])    2012 LT ARM MELANOMA    Chronic kidney disease    Blood in urine   Depression    Encounter for insertion of mirena IUD 2008   Fibroids    Osteopenia    PONV (postoperative nausea and vomiting)    Proteinuria    ever since childhood   Shingles    SI (sacroiliac) joint dysfunction    Sleep apnea     Past Surgical History:  Procedure Laterality Date   BUNIONECTOMY     BILAT 1995    COLONOSCOPY     MULTIPLE TOOTH EXTRACTIONS     SACROILIAC JOINT FUSION  02/14/2012   Procedure: SACROILIAC JOINT FUSION;  Surgeon: Sinclair Ship, MD;  Location: [facility];  Service: Orthopedics;  Laterality: Left;  Left sacroiliac joint fusion   SACROILIAC JOINT FUSION  Left 03/03/2020   Procedure: REVISION LEFT SACROILIAC JOINT FUSION;  Surgeon: Phylliss Bob, MD;  Location: [facility];  Service: Orthopedics;  Laterality: Left;   WISDOM TOOTH EXTRACTION      Family History  Problem Relation Age of Onset   Kidney disease Father    Dementia Mother    Stroke Mother    Rheum arthritis Mother     Social History   Socioeconomic History   Marital status: Single    Spouse name: Not on file   Number of children: Not on file   Years of education: Not on file   Highest education level: Not on file  Occupational History   Not on file  Tobacco Use   Smoking status: Never   Smokeless tobacco: Never  Vaping Use   Vaping Use: Never used  Substance and Sexual Activity   Alcohol use: Yes    Comment: occasionally maybe 2x a week   Drug use: No   Sexual activity: Yes    Birth control/protection: None  Other Topics Concern   Not on file  Social History Narrative   Not on file   Social Determinants of Health   Financial Resource Strain: Not on file  Food Insecurity: Not on file  Transportation Needs: Not on file  Physical Activity: Not on file  Stress: Not on file  Social Connections: Not on file  Intimate  Partner Violence: Not on file    Allergies  Allergen Reactions   Codeine Nausea Only and Other (See Comments)    Dizziness   Hydrocodone Nausea And Vomiting   Doxycycline Hyclate Rash and Other (See Comments)   Sertraline Rash and Other (See Comments)    Current Outpatient Medications  Medication Sig Dispense Refill   ALPRAZolam (XANAX) 0.25 MG tablet Take 0.25 mg by mouth at bedtime as needed for sleep.      calcium carbonate (OSCAL) 1500 (600 Ca) MG TABS tablet 1 tablet with meals     carboxymethylcellulose (REFRESH PLUS) 0.5 % SOLN Place 1 drop into both eyes 4 times daily.     escitalopram (LEXAPRO) 10 MG tablet 1 tablet     fluticasone (CUTIVATE) 0.05 % cream fluticasone propionate 0.05 % topical cream  APPLY TOPICALLY TO THE AFFECTED  AREA TWICE DAILY     ketotifen (ZADITOR) 0.025 % ophthalmic solution Place 1 drop into both eyes 2 (two) times daily as needed (allergies).     methocarbamol (ROBAXIN) 500 MG tablet Take 1 tablet (500 mg total) by mouth every 6 (six) hours as needed for muscle spasms. 60 tablet 0   Multiple Vitamin (M.V.I. ADULT IV) See admin instructions.     ondansetron (ZOFRAN) 4 MG tablet Take 1 tablet (4 mg total) by mouth every 8 (eight) hours as needed for nausea or vomiting. 20 tablet 0   traMADol (ULTRAM) 50 MG tablet tramadol 50 mg tablet  TAKE 1 TO 2 TABLETS BY MOUTH THREE TIMES DAILY AS NEEDED FOR PAIN (Patient not taking: Reported on 04/19/2021)     valACYclovir (VALTREX) 1000 MG tablet valacyclovir 1 gram tablet  TAKE 2 TABLETS TWICE DAILY FOR 2DAYS WHEN COLD SORE BEGINS (Patient not taking: Reported on 04/19/2021)     No current facility-administered medications for this visit.    REVIEW OF SYSTEMS:  [X]  denotes positive finding, [ ]  denotes negative finding Cardiac  Comments:  Chest pain or chest pressure:    Shortness of breath upon exertion:    Short of breath when lying flat:    Irregular heart rhythm:        Vascular    Pain in calf, thigh, or hip brought on by ambulation:    Pain in feet at night that wakes you up from your sleep:     Blood clot in your veins:    Leg swelling:         Pulmonary    Oxygen at home:    Productive cough:     Wheezing:         Neurologic    Sudden weakness in arms or legs:     Sudden numbness in arms or legs:     Sudden onset of difficulty speaking or slurred speech:    Temporary loss of vision in one eye:     Problems with dizziness:         Gastrointestinal    Blood in stool:     Vomited blood:         Genitourinary    Burning when urinating:     Blood in urine:        Psychiatric    Major depression:         Hematologic    Bleeding problems:    Problems with blood clotting too easily:        Skin    Rashes or ulcers:         Constitutional  Fever or chills:      PHYSICAL EXAM Vitals:   04/19/21 0827  BP: 124/84  Pulse: (!) 59  Resp: 20  Temp: 98.2 F (36.8 C)  SpO2: 98%  Weight: 177 lb (80.3 kg)  Height: 5\' 7"  (1.702 m)    Constitutional: well appearing. no distress. Appears well nourished.  Neurologic: CN intact. no focal findings. no sensory loss. Psychiatric:  Mood and affect symmetric and appropriate. Eyes:  No icterus. No conjunctival pallor. Ears, nose, throat:  mucous membranes moist. Midline trachea.  Cardiac: regular rate and rhythm.  Respiratory:  unlabored. Abdominal:  soft, non-tender, non-distended. No abdominal scars Extremity: no edema. no cyanosis. no pallor.  Skin: no gangrene. no ulceration.  Lymphatic: no Stemmer's sign. no palpable lymphadenopathy.  PERTINENT LABORATORY AND RADIOLOGIC DATA  Most recent CBC CBC Latest Ref Rng & Units 03/01/2020 01/13/2014 01/11/2014  WBC 4.0 - 10.5 K/uL 6.1 6.4 5.2  Hemoglobin 12.0 - 15.0 g/dL 13.6 13.5 13.0  Hematocrit 36.0 - 46.0 % 43.3 40.9 37.8  Platelets 150 - 400 K/uL 256 236 219     Most recent CMP CMP Latest Ref Rng & Units 03/01/2020 01/13/2014 01/11/2014  Glucose 70 - 99 mg/dL 85 124(H) 96  BUN 8 - 23 mg/dL 15 12 17   Creatinine 0.44 - 1.00 mg/dL 0.88 0.65 0.70  Sodium 135 - 145 mmol/L 139 140 140  Potassium 3.5 - 5.1 mmol/L 3.6 3.6(L) 3.9  Chloride 98 - 111 mmol/L 103 101 101  CO2 22 - 32 mmol/L 25 26 28   Calcium 8.9 - 10.3 mg/dL 9.7 9.6 9.7  Total Protein 6.5 - 8.1 g/dL 7.1 7.0 6.6  Total Bilirubin 0.3 - 1.2 mg/dL 0.8 0.5 0.3  Alkaline Phos 38 - 126 U/L 63 73 70  AST 15 - 41 U/L 22 16 16   ALT 0 - 44 U/L 19 12 11    MRI and previous CT scan of the abdomen pelvis personally reviewed.  The L5/S1 disc space appears safe to approach anteriorly with no aberrant vascular anatomy overlying the disc.  Yevonne Aline. Stanford Breed, MD [facility] Phone Number: [phone] 04/19/2021 9:15  AM  Total time spent on preparing this encounter including chart review, data review, collecting history, examining the patient, coordinating care for this new patient, 60 minutes.  Portions of this report may have been transcribed using voice recognition software.  Every effort has been made to ensure accuracy; however, inadvertent computerized transcription errors may still be present.

## 2021-04-18 NOTE — H&P (View-Only) (Signed)
[facility] [place]  ASSESSMENT / PLAN: Kelsey Fitzpatrick is a 66 y.o. female with plans to undergo L5/S1 anterior lumbar interbody fusion on 05/12/21 with Dr. Lynann Bologna. I have reviewed the patient's imaging and feel it is safe to approach this anteriorly. I discussed the specific risks associated with spine exposure including vascular injury, urethral injury, bowel injury, nervous injury. I explained that if severe vascular injury was encountered we may need to abandon the spinal procedure to safely repair the injury. I explained the typical postoperative course for anterior exposure of the spine, including small risk of postoperative ileus. The patient was understanding and wished to proceed.  CHIEF COMPLAINT: back pain  HISTORY OF PRESENT ILLNESS: Kelsey Fitzpatrick is a 66 y.o. female referred to clinic for discussion of the exposure of anterior lumbar interbody fusion.  Patient has had no prior abdominal surgeries.  She reports no prior pelvic or abdominal radiation therapy.  Much of the visit was spent discussing the operative conduct and the risks of this approach.  Past Medical History:  Diagnosis Date   Abnormal Pap smear 2006   ASCUS   Anxiety    Arthritis    OA    Back pain    Cancer ([place])    2012 LT ARM MELANOMA    Chronic kidney disease    Blood in urine   Depression    Encounter for insertion of mirena IUD 2008   Fibroids    Osteopenia    PONV (postoperative nausea and vomiting)    Proteinuria    ever since childhood   Shingles    SI (sacroiliac) joint dysfunction    Sleep apnea     Past Surgical History:  Procedure Laterality Date   BUNIONECTOMY     BILAT 1995    COLONOSCOPY     MULTIPLE TOOTH EXTRACTIONS     SACROILIAC JOINT FUSION  02/14/2012   Procedure: SACROILIAC JOINT FUSION;  Surgeon: Sinclair Ship, MD;  Location: [facility];  Service: Orthopedics;  Laterality: Left;  Left sacroiliac joint fusion   SACROILIAC JOINT FUSION  Left 03/03/2020   Procedure: REVISION LEFT SACROILIAC JOINT FUSION;  Surgeon: Phylliss Bob, MD;  Location: [facility];  Service: Orthopedics;  Laterality: Left;   WISDOM TOOTH EXTRACTION      Family History  Problem Relation Age of Onset   Kidney disease Father    Dementia Mother    Stroke Mother    Rheum arthritis Mother     Social History   Socioeconomic History   Marital status: Single    Spouse name: Not on file   Number of children: Not on file   Years of education: Not on file   Highest education level: Not on file  Occupational History   Not on file  Tobacco Use   Smoking status: Never   Smokeless tobacco: Never  Vaping Use   Vaping Use: Never used  Substance and Sexual Activity   Alcohol use: Yes    Comment: occasionally maybe 2x a week   Drug use: No   Sexual activity: Yes    Birth control/protection: None  Other Topics Concern   Not on file  Social History Narrative   Not on file   Social Determinants of Health   Financial Resource Strain: Not on file  Food Insecurity: Not on file  Transportation Needs: Not on file  Physical Activity: Not on file  Stress: Not on file  Social Connections: Not on file  Intimate  Partner Violence: Not on file    Allergies  Allergen Reactions   Codeine Nausea Only and Other (See Comments)    Dizziness   Hydrocodone Nausea And Vomiting   Doxycycline Hyclate Rash and Other (See Comments)   Sertraline Rash and Other (See Comments)    Current Outpatient Medications  Medication Sig Dispense Refill   ALPRAZolam (XANAX) 0.25 MG tablet Take 0.25 mg by mouth at bedtime as needed for sleep.      calcium carbonate (OSCAL) 1500 (600 Ca) MG TABS tablet 1 tablet with meals     carboxymethylcellulose (REFRESH PLUS) 0.5 % SOLN Place 1 drop into both eyes 4 times daily.     escitalopram (LEXAPRO) 10 MG tablet 1 tablet     fluticasone (CUTIVATE) 0.05 % cream fluticasone propionate 0.05 % topical cream  APPLY TOPICALLY TO THE AFFECTED  AREA TWICE DAILY     ketotifen (ZADITOR) 0.025 % ophthalmic solution Place 1 drop into both eyes 2 (two) times daily as needed (allergies).     methocarbamol (ROBAXIN) 500 MG tablet Take 1 tablet (500 mg total) by mouth every 6 (six) hours as needed for muscle spasms. 60 tablet 0   Multiple Vitamin (M.V.I. ADULT IV) See admin instructions.     ondansetron (ZOFRAN) 4 MG tablet Take 1 tablet (4 mg total) by mouth every 8 (eight) hours as needed for nausea or vomiting. 20 tablet 0   traMADol (ULTRAM) 50 MG tablet tramadol 50 mg tablet  TAKE 1 TO 2 TABLETS BY MOUTH THREE TIMES DAILY AS NEEDED FOR PAIN (Patient not taking: Reported on 04/19/2021)     valACYclovir (VALTREX) 1000 MG tablet valacyclovir 1 gram tablet  TAKE 2 TABLETS TWICE DAILY FOR 2DAYS WHEN COLD SORE BEGINS (Patient not taking: Reported on 04/19/2021)     No current facility-administered medications for this visit.    REVIEW OF SYSTEMS:  [X]  denotes positive finding, [ ]  denotes negative finding Cardiac  Comments:  Chest pain or chest pressure:    Shortness of breath upon exertion:    Short of breath when lying flat:    Irregular heart rhythm:        Vascular    Pain in calf, thigh, or hip brought on by ambulation:    Pain in feet at night that wakes you up from your sleep:     Blood clot in your veins:    Leg swelling:         Pulmonary    Oxygen at home:    Productive cough:     Wheezing:         Neurologic    Sudden weakness in arms or legs:     Sudden numbness in arms or legs:     Sudden onset of difficulty speaking or slurred speech:    Temporary loss of vision in one eye:     Problems with dizziness:         Gastrointestinal    Blood in stool:     Vomited blood:         Genitourinary    Burning when urinating:     Blood in urine:        Psychiatric    Major depression:         Hematologic    Bleeding problems:    Problems with blood clotting too easily:        Skin    Rashes or ulcers:         Constitutional  Fever or chills:      PHYSICAL EXAM Vitals:   04/19/21 0827  BP: 124/84  Pulse: (!) 59  Resp: 20  Temp: 98.2 F (36.8 C)  SpO2: 98%  Weight: 177 lb (80.3 kg)  Height: 5\' 7"  (1.702 m)    Constitutional: well appearing. no distress. Appears well nourished.  Neurologic: CN intact. no focal findings. no sensory loss. Psychiatric:  Mood and affect symmetric and appropriate. Eyes:  No icterus. No conjunctival pallor. Ears, nose, throat:  mucous membranes moist. Midline trachea.  Cardiac: regular rate and rhythm.  Respiratory:  unlabored. Abdominal:  soft, non-tender, non-distended. No abdominal scars Extremity: no edema. no cyanosis. no pallor.  Skin: no gangrene. no ulceration.  Lymphatic: no Stemmer's sign. no palpable lymphadenopathy.  PERTINENT LABORATORY AND RADIOLOGIC DATA  Most recent CBC CBC Latest Ref Rng & Units 03/01/2020 01/13/2014 01/11/2014  WBC 4.0 - 10.5 K/uL 6.1 6.4 5.2  Hemoglobin 12.0 - 15.0 g/dL 13.6 13.5 13.0  Hematocrit 36.0 - 46.0 % 43.3 40.9 37.8  Platelets 150 - 400 K/uL 256 236 219     Most recent CMP CMP Latest Ref Rng & Units 03/01/2020 01/13/2014 01/11/2014  Glucose 70 - 99 mg/dL 85 124(H) 96  BUN 8 - 23 mg/dL 15 12 17   Creatinine 0.44 - 1.00 mg/dL 0.88 0.65 0.70  Sodium 135 - 145 mmol/L 139 140 140  Potassium 3.5 - 5.1 mmol/L 3.6 3.6(L) 3.9  Chloride 98 - 111 mmol/L 103 101 101  CO2 22 - 32 mmol/L 25 26 28   Calcium 8.9 - 10.3 mg/dL 9.7 9.6 9.7  Total Protein 6.5 - 8.1 g/dL 7.1 7.0 6.6  Total Bilirubin 0.3 - 1.2 mg/dL 0.8 0.5 0.3  Alkaline Phos 38 - 126 U/L 63 73 70  AST 15 - 41 U/L 22 16 16   ALT 0 - 44 U/L 19 12 11    MRI and previous CT scan of the abdomen pelvis personally reviewed.  The L5/S1 disc space appears safe to approach anteriorly with no aberrant vascular anatomy overlying the disc.  Yevonne Aline. Stanford Breed, MD [facility] Phone Number: [phone] 04/19/2021 9:15  AM  Total time spent on preparing this encounter including chart review, data review, collecting history, examining the patient, coordinating care for this new patient, 60 minutes.  Portions of this report may have been transcribed using voice recognition software.  Every effort has been made to ensure accuracy; however, inadvertent computerized transcription errors may still be present.

## 2021-04-19 ENCOUNTER — Other Ambulatory Visit: Payer: Self-pay

## 2021-04-19 ENCOUNTER — Encounter: Payer: Self-pay | Admitting: Vascular Surgery

## 2021-04-19 ENCOUNTER — Ambulatory Visit: Payer: BC Managed Care – PPO | Admitting: Vascular Surgery

## 2021-04-19 VITALS — BP 124/84 | HR 59 | Temp 98.2°F | Resp 20 | Ht 67.0 in | Wt 177.0 lb

## 2021-04-19 DIAGNOSIS — M4317 Spondylolisthesis, lumbosacral region: Secondary | ICD-10-CM

## 2021-05-04 ENCOUNTER — Other Ambulatory Visit: Payer: Self-pay

## 2021-05-06 NOTE — Pre-Procedure Instructions (Signed)
Surgical Instructions    Your procedure is scheduled on 05/12/21.  Report to [facility] Main Entrance "A" at 05:30 A.M., then check in with the Admitting office.  Call this number if you have problems the morning of surgery:  [phone]   If you have any questions prior to your surgery date call [phone]: Open Monday-Friday 8am-4pm    Remember:  Do not eat after midnight the night before your surgery  You may drink clear liquids until 04:30 the morning of your surgery.   Clear liquids allowed are: Water, Non-Citrus Juices (without pulp), Carbonated Beverages, Clear Tea, Black Coffee ONLY (NO MILK, CREAM OR POWDERED CREAMER of any kind), and Gatorade   **Please complete your PRE-SURGERY ENSURE that was provided to you by 04:30 the morning of surgery.  Please, if able, drink it in one setting. DO NOT SIP.    Take these medicines the morning of surgery with A SIP OF WATER  carboxymethylcellulose (REFRESH PLUS) doxycycline (MONODOX) escitalopram (LEXAPRO) ketotifen (ZADITOR) 0.025 % ophthalmic    As of today, STOP taking any Aspirin (unless otherwise instructed by your surgeon) Aleve, Naproxen, Ibuprofen, Motrin, Advil, Goody's, BC's, all herbal medications, fish oil, and all vitamins.  After your COVID test   You are not required to quarantine however you are required to wear a well-fitting mask when you are out and around people not in your household.  If your mask becomes wet or soiled, replace with a new one.  Wash your hands often with soap and water for 20 seconds or clean your hands with an alcohol-based hand sanitizer that contains at least 60% alcohol.  Do not share personal items.  Notify your provider: if you are in close contact with someone who has COVID  or if you develop a fever of 100.4 or greater, sneezing, cough, sore throat, shortness of breath or body aches.           Do not wear jewelry or makeup Do not wear lotions, powders, perfumes/colognes, or  deodorant. Do not shave 48 hours prior to surgery.  Men may shave face and neck. Do not bring valuables to the hospital. DO Not wear nail polish, gel polish, artificial nails, or any other type of covering on natural nails (fingers and toes) If you have artificial nails or gel coating that need to be removed by a nail salon, please have this removed prior to surgery. Artificial nails or gel coating may interfere with anesthesia's ability to adequately monitor your vital signs.             [facility] is not responsible for any belongings or valuables.  Do NOT Smoke (Tobacco/Vaping)  24 hours prior to your procedure  If you use a CPAP at night, you may bring your mask for your overnight stay.   Contacts, glasses, hearing aids, dentures or partials may not be worn into surgery, please bring cases for these belongings   For patients admitted to the hospital, discharge time will be determined by your treatment team.   Patients discharged the day of surgery will not be allowed to drive home, and someone needs to stay with them for 24 hours.  NO VISITORS WILL BE ALLOWED IN PRE-OP WHERE PATIENTS ARE PREPPED FOR SURGERY.  ONLY 1 SUPPORT PERSON MAY BE PRESENT IN THE WAITING ROOM WHILE YOU ARE IN SURGERY.  IF YOU ARE TO BE ADMITTED, ONCE YOU ARE IN YOUR ROOM YOU WILL BE ALLOWED TWO (2) VISITORS. 1 (ONE) VISITOR MAY STAY OVERNIGHT  BUT MUST ARRIVE TO THE ROOM BY 8pm.  Minor children may have two parents present. Special consideration for safety and communication needs will be reviewed on a case by case basis.  Special instructions:    Oral Hygiene is also important to reduce your risk of infection.  Remember - BRUSH YOUR TEETH THE MORNING OF SURGERY WITH YOUR REGULAR TOOTHPASTE   [facility]- Preparing For Surgery  Before surgery, you can play an important role. Because skin is not sterile, your skin needs to be as free of germs as possible. You can reduce the number of germs on your skin by washing  with CHG (chlorahexidine gluconate) Soap before surgery.  CHG is an antiseptic cleaner which kills germs and bonds with the skin to continue killing germs even after washing.     Please do not use if you have an allergy to CHG or antibacterial soaps. If your skin becomes reddened/irritated stop using the CHG.  Do not shave (including legs and underarms) for at least 48 hours prior to first CHG shower. It is OK to shave your face.  Please follow these instructions carefully.     Shower the NIGHT BEFORE SURGERY and the MORNING OF SURGERY with CHG Soap.   If you chose to wash your hair, wash your hair first as usual with your normal shampoo. After you shampoo, rinse your hair and body thoroughly to remove the shampoo.  Then [employer] and genitals (private parts) with your normal soap and rinse thoroughly to remove soap.  After that Use CHG Soap as you would any other liquid soap. You can apply CHG directly to the skin and wash gently with a scrungie or a clean washcloth.   Apply the CHG Soap to your body ONLY FROM THE NECK DOWN.  Do not use on open wounds or open sores. Avoid contact with your eyes, ears, mouth and genitals (private parts). Wash Face and genitals (private parts)  with your normal soap.   Wash thoroughly, paying special attention to the area where your surgery will be performed.  Thoroughly rinse your body with warm water from the neck down.  DO NOT shower/wash with your normal soap after using and rinsing off the CHG Soap.  Pat yourself dry with a CLEAN TOWEL.  Wear CLEAN PAJAMAS to bed the night before surgery  Place CLEAN SHEETS on your bed the night before your surgery  DO NOT SLEEP WITH PETS.   Day of Surgery:  Take a shower with CHG soap. Wear Clean/Comfortable clothing the morning of surgery Do not apply any deodorants/lotions.   Remember to brush your teeth WITH YOUR REGULAR TOOTHPASTE.   Please read over the following fact sheets that you were given.

## 2021-05-09 ENCOUNTER — Other Ambulatory Visit: Payer: Self-pay

## 2021-05-09 ENCOUNTER — Encounter (HOSPITAL_COMMUNITY): Payer: Self-pay

## 2021-05-09 ENCOUNTER — Encounter (HOSPITAL_COMMUNITY)
Admission: RE | Admit: 2021-05-09 | Discharge: 2021-05-09 | Disposition: A | Discharge status: Home / Self Care | Payer: BC Managed Care – PPO | Source: Ambulatory Visit | Attending: Orthopedic Surgery | Admitting: Orthopedic Surgery

## 2021-05-09 VITALS — BP 142/88 | HR 72 | Temp 97.6°F | Resp 18 | Ht 67.0 in | Wt 178.0 lb

## 2021-05-09 DIAGNOSIS — Z20822 Contact with and (suspected) exposure to covid-19: Secondary | ICD-10-CM | POA: Insufficient documentation

## 2021-05-09 DIAGNOSIS — Z01812 Encounter for preprocedural laboratory examination: Secondary | ICD-10-CM | POA: Insufficient documentation

## 2021-05-09 DIAGNOSIS — Z01818 Encounter for other preprocedural examination: Secondary | ICD-10-CM

## 2021-05-09 LAB — TYPE AND SCREEN
ABO/RH(D): O POS
Antibody Screen: NEGATIVE

## 2021-05-09 LAB — CBC
HCT: 42.2 % (ref 36.0–46.0)
Hemoglobin: 13.5 g/dL (ref 12.0–15.0)
MCH: 31 pg (ref 26.0–34.0)
MCHC: 32 g/dL (ref 30.0–36.0)
MCV: 97 fL (ref 80.0–100.0)
Platelets: 243 10*3/uL (ref 150–400)
RBC: 4.35 MIL/uL (ref 3.87–5.11)
RDW: 14 % (ref 11.5–15.5)
WBC: 7.4 10*3/uL (ref 4.0–10.5)
nRBC: 0 % (ref 0.0–0.2)

## 2021-05-09 LAB — BASIC METABOLIC PANEL
Anion gap: 6 (ref 5–15)
BUN: 21 mg/dL (ref 8–23)
CO2: 28 mmol/L (ref 22–32)
Calcium: 9.1 mg/dL (ref 8.9–10.3)
Chloride: 104 mmol/L (ref 98–111)
Creatinine, Ser: 0.71 mg/dL (ref 0.44–1.00)
GFR, Estimated: >60 mL/min (ref >60)
Glucose, Bld: 137 mg/dL — ABNORMAL HIGH (ref 70–99)
Potassium: 3.6 mmol/L (ref 3.5–5.1)
Sodium: 138 mmol/L (ref 135–145)

## 2021-05-09 LAB — SURGICAL PCR SCREEN
MRSA, PCR: NEGATIVE
Staphylococcus aureus: NEGATIVE

## 2021-05-09 NOTE — Progress Notes (Signed)
PCP - Kelton Pillar, MD Cardiologist - denies  PPM/ICD - denies Device Orders - n/a Rep Notified - n/a  Chest x-ray - n/a EKG - n/a Stress Test - denies ECHO - denies Cardiac Cath - denies  Sleep Study - yes positive for OSA CPAP - yes  Fasting Blood Sugar - n/a  Blood Thinner Instructions: n/a  Aspirin Instructions: Patient was instructed: As of today, STOP taking any Aspirin (unless otherwise instructed by your surgeon) Aleve, Naproxen, Ibuprofen, Motrin, Advil, Goody's, BC's, all herbal medications, fish oil, and all vitamins.  ERAS Protcol - yes PRE-SURGERY Ensure - yes, until 04:30 o'clock  COVID TEST- done in PAT on 05/09/2021   Anesthesia review: no  Patient denies shortness of breath, fever, cough and chest pain at PAT appointment   All instructions explained to the patient, with a verbal understanding of the material. Patient agrees to go over the instructions while at home for a better understanding. Patient also instructed to self quarantine after being tested for COVID-19. The opportunity to ask questions was provided.

## 2021-05-10 LAB — SARS CORONAVIRUS 2 (TAT 6-24 HRS): SARS Coronavirus 2: NEGATIVE

## 2021-05-11 NOTE — Anesthesia Preprocedure Evaluation (Addendum)
Anesthesia Evaluation  Patient identified by MRN, date of birth, ID band Patient awake    Reviewed: Allergy & Precautions, NPO status , Patient's Chart, lab work & pertinent test results  History of Anesthesia Complications (+) PONV and history of anesthetic complications  Airway Mallampati: III  TM Distance: >3 FB Neck ROM: Full    Dental no notable dental hx.    Pulmonary sleep apnea ,    Pulmonary exam normal        Cardiovascular negative cardio ROS Normal cardiovascular exam     Neuro/Psych negative neurological ROS  negative psych ROS   GI/Hepatic negative GI ROS, Neg liver ROS,   Endo/Other  negative endocrine ROS  Renal/GU Renal InsufficiencyRenal disease  negative genitourinary   Musculoskeletal  (+) Arthritis ,   Abdominal   Peds  Hematology negative hematology ROS (+)   Anesthesia Other Findings Day of surgery medications reviewed with patient.  Reproductive/Obstetrics negative OB ROS                            Anesthesia Physical Anesthesia Plan  ASA: 2  Anesthesia Plan: General   Post-op Pain Management: Tylenol PO (pre-op) and Ketamine IV   Induction: Intravenous  PONV Risk Score and Plan: 4 or greater and Treatment may vary due to age or medical condition, Ondansetron, Dexamethasone, Propofol infusion, Midazolam, Droperidol and Scopolamine patch - Pre-op  Airway Management Planned: Oral ETT  Additional Equipment: Arterial line  Intra-op Plan:   Post-operative Plan: Extubation in OR  Informed Consent: I have reviewed the patients History and Physical, chart, labs and discussed the procedure including the risks, benefits and alternatives for the proposed anesthesia with the patient or authorized representative who has indicated his/her understanding and acceptance.     Dental advisory given  Plan Discussed with: CRNA  Anesthesia Plan Comments: (Ketamine  30mg  prior to incision then 10mg  Q1h intraop Droperidol 0.625mg  at end of case)      Anesthesia Quick Evaluation

## 2021-05-12 ENCOUNTER — Inpatient Hospital Stay (HOSPITAL_COMMUNITY): Payer: BC Managed Care – PPO

## 2021-05-12 ENCOUNTER — Encounter (HOSPITAL_COMMUNITY)
Admission: RE | Disposition: A | Discharge status: Home / Self Care | Payer: Self-pay | Source: Home / Self Care | Attending: Orthopedic Surgery

## 2021-05-12 ENCOUNTER — Other Ambulatory Visit: Payer: Self-pay

## 2021-05-12 ENCOUNTER — Inpatient Hospital Stay (HOSPITAL_COMMUNITY): Payer: BC Managed Care – PPO | Admitting: Anesthesiology

## 2021-05-12 ENCOUNTER — Inpatient Hospital Stay (HOSPITAL_COMMUNITY)
Admission: RE | Admit: 2021-05-12 | Discharge: 2021-05-13 | DRG: 455 | Disposition: A | Discharge status: Home / Self Care | Payer: BC Managed Care – PPO | Attending: Orthopedic Surgery | Admitting: Orthopedic Surgery

## 2021-05-12 ENCOUNTER — Encounter (HOSPITAL_COMMUNITY): Payer: Self-pay | Admitting: Orthopedic Surgery

## 2021-05-12 DIAGNOSIS — Z885 Allergy status to narcotic agent status: Secondary | ICD-10-CM | POA: Diagnosis not present

## 2021-05-12 DIAGNOSIS — G4730 Sleep apnea, unspecified: Secondary | ICD-10-CM | POA: Diagnosis present

## 2021-05-12 DIAGNOSIS — Z85820 Personal history of malignant melanoma of skin: Secondary | ICD-10-CM | POA: Diagnosis not present

## 2021-05-12 DIAGNOSIS — Z888 Allergy status to other drugs, medicaments and biological substances status: Secondary | ICD-10-CM

## 2021-05-12 DIAGNOSIS — M5117 Intervertebral disc disorders with radiculopathy, lumbosacral region: Principal | ICD-10-CM | POA: Diagnosis present

## 2021-05-12 DIAGNOSIS — M48061 Spinal stenosis, lumbar region without neurogenic claudication: Secondary | ICD-10-CM | POA: Diagnosis present

## 2021-05-12 DIAGNOSIS — F419 Anxiety disorder, unspecified: Secondary | ICD-10-CM | POA: Diagnosis present

## 2021-05-12 DIAGNOSIS — F32A Depression, unspecified: Secondary | ICD-10-CM | POA: Diagnosis present

## 2021-05-12 DIAGNOSIS — Z881 Allergy status to other antibiotic agents status: Secondary | ICD-10-CM

## 2021-05-12 DIAGNOSIS — Z981 Arthrodesis status: Secondary | ICD-10-CM

## 2021-05-12 DIAGNOSIS — Z419 Encounter for procedure for purposes other than remedying health state, unspecified: Secondary | ICD-10-CM

## 2021-05-12 DIAGNOSIS — Z20822 Contact with and (suspected) exposure to covid-19: Secondary | ICD-10-CM | POA: Diagnosis present

## 2021-05-12 DIAGNOSIS — M5410 Radiculopathy, site unspecified: Secondary | ICD-10-CM | POA: Diagnosis present

## 2021-05-12 DIAGNOSIS — M4317 Spondylolisthesis, lumbosacral region: Secondary | ICD-10-CM | POA: Diagnosis present

## 2021-05-12 DIAGNOSIS — Z79899 Other long term (current) drug therapy: Secondary | ICD-10-CM | POA: Diagnosis not present

## 2021-05-12 HISTORY — PX: ANTERIOR LUMBAR FUSION: SHX1170

## 2021-05-12 HISTORY — PX: ABDOMINAL EXPOSURE: SHX5708

## 2021-05-12 SURGERY — ANTERIOR LUMBAR FUSION 1 LEVEL
Anesthesia: General | Site: Spine Lumbar

## 2021-05-12 MED ORDER — DOXYCYCLINE MONOHYDRATE 50 MG PO CAPS: 50.0000 mg | ORAL_CAPSULE | Freq: Every day | ORAL | Status: DC

## 2021-05-12 MED ORDER — ACETAMINOPHEN 325 MG PO TABS
650.0000 mg | ORAL_TABLET | ORAL | Status: DC | PRN
  Administered 2021-05-13: 650 mg via ORAL
  Administered 2021-05-13 (×2): 325 mg via ORAL
  Filled 2021-05-12: qty 2

## 2021-05-12 MED ORDER — ORAL CARE MOUTH RINSE: 15.0000 mL | Freq: Once | OROMUCOSAL | Status: AC

## 2021-05-12 MED ORDER — HYDROMORPHONE HCL 1 MG/ML IJ SOLN
INTRAMUSCULAR | Status: AC
  Filled 2021-05-12: qty 1

## 2021-05-12 MED ORDER — ONDANSETRON HCL 4 MG/2ML IJ SOLN
INTRAMUSCULAR | Status: AC
  Filled 2021-05-12: qty 2

## 2021-05-12 MED ORDER — PROPOFOL 10 MG/ML IV BOLUS
INTRAVENOUS | Status: AC
  Filled 2021-05-12: qty 20

## 2021-05-12 MED ORDER — MENTHOL 3 MG MT LOZG
1.0000 | LOZENGE | OROMUCOSAL | Status: DC | PRN
  Filled 2021-05-12: qty 9

## 2021-05-12 MED ORDER — KETOTIFEN FUMARATE 0.025 % OP SOLN
1.0000 [drp] | Freq: Two times a day (BID) | OPHTHALMIC | Status: DC | PRN
  Filled 2021-05-12: qty 5

## 2021-05-12 MED ORDER — DEXAMETHASONE SODIUM PHOSPHATE 10 MG/ML IJ SOLN
INTRAMUSCULAR | Status: DC | PRN
  Administered 2021-05-12: 10 mg via INTRAVENOUS

## 2021-05-12 MED ORDER — CHLORHEXIDINE GLUCONATE CLOTH 2 % EX PADS: 6.0000 | MEDICATED_PAD | Freq: Once | CUTANEOUS | Status: DC

## 2021-05-12 MED ORDER — DOCUSATE SODIUM 100 MG PO CAPS
100.0000 mg | ORAL_CAPSULE | Freq: Two times a day (BID) | ORAL | Status: DC
  Administered 2021-05-12: 100 mg via ORAL
  Filled 2021-05-12 (×2): qty 1

## 2021-05-12 MED ORDER — PROPOFOL 10 MG/ML IV BOLUS
INTRAVENOUS | Status: DC | PRN
Start: 2021-05-12 — End: 2021-05-12
  Administered 2021-05-12: 150 mg via INTRAVENOUS
  Administered 2021-05-12: 50 mg via INTRAVENOUS

## 2021-05-12 MED ORDER — ACETAMINOPHEN 650 MG RE SUPP: 650.0000 mg | RECTAL | Status: DC | PRN

## 2021-05-12 MED ORDER — BISACODYL 5 MG PO TBEC: 5.0000 mg | DELAYED_RELEASE_TABLET | Freq: Every day | ORAL | Status: DC | PRN

## 2021-05-12 MED ORDER — PROMETHAZINE HCL 12.5 MG PO TABS
12.5000 mg | ORAL_TABLET | ORAL | Status: DC | PRN
  Filled 2021-05-12: qty 1

## 2021-05-12 MED ORDER — M.V.I. ADULT IV INJ: 10.0000 mL | INJECTION | Freq: Every day | INTRAVENOUS | Status: DC

## 2021-05-12 MED ORDER — ONDANSETRON HCL 4 MG/2ML IJ SOLN: 4.0000 mg | Freq: Four times a day (QID) | INTRAMUSCULAR | Status: DC | PRN

## 2021-05-12 MED ORDER — SUGAMMADEX SODIUM 200 MG/2ML IV SOLN
INTRAVENOUS | Status: DC | PRN
Start: 2021-05-12 — End: 2021-05-12
  Administered 2021-05-12: 400 mg via INTRAVENOUS

## 2021-05-12 MED ORDER — SCOPOLAMINE 1 MG/3DAYS TD PT72
1.0000 | MEDICATED_PATCH | Freq: Once | TRANSDERMAL | Status: DC
  Administered 2021-05-12: 1.5 mg via TRANSDERMAL
  Filled 2021-05-12: qty 1

## 2021-05-12 MED ORDER — HYDROMORPHONE HCL 1 MG/ML IJ SOLN
0.2500 mg | INTRAMUSCULAR | Status: DC | PRN
  Administered 2021-05-12 (×3): 0.5 mg via INTRAVENOUS

## 2021-05-12 MED ORDER — MORPHINE SULFATE (PF) 2 MG/ML IV SOLN: 1.0000 mg | INTRAVENOUS | Status: DC | PRN

## 2021-05-12 MED ORDER — CEFAZOLIN SODIUM-DEXTROSE 2-4 GM/100ML-% IV SOLN
2.0000 g | Freq: Three times a day (TID) | INTRAVENOUS | Status: AC
  Administered 2021-05-12 (×2): 2 g via INTRAVENOUS
  Filled 2021-05-12 (×2): qty 100

## 2021-05-12 MED ORDER — BUPIVACAINE LIPOSOME 1.3 % IJ SUSP
INTRAMUSCULAR | Status: AC
  Filled 2021-05-12: qty 20

## 2021-05-12 MED ORDER — KETAMINE HCL 50 MG/5ML IJ SOSY
PREFILLED_SYRINGE | INTRAMUSCULAR | Status: AC
  Filled 2021-05-12: qty 10

## 2021-05-12 MED ORDER — FLEET ENEMA 7-19 GM/118ML RE ENEM: 1.0000 | ENEMA | Freq: Once | RECTAL | Status: DC | PRN

## 2021-05-12 MED ORDER — ZOLPIDEM TARTRATE 5 MG PO TABS: 5.0000 mg | ORAL_TABLET | Freq: Every evening | ORAL | Status: DC | PRN

## 2021-05-12 MED ORDER — PHENYLEPHRINE 40 MCG/ML (10ML) SYRINGE FOR IV PUSH (FOR BLOOD PRESSURE SUPPORT)
PREFILLED_SYRINGE | INTRAVENOUS | Status: AC
  Filled 2021-05-12: qty 10

## 2021-05-12 MED ORDER — SODIUM CHLORIDE 0.9 % IV SOLN: 250.0000 mL | INTRAVENOUS | Status: DC

## 2021-05-12 MED ORDER — BUPIVACAINE LIPOSOME 1.3 % IJ SUSP
INTRAMUSCULAR | Status: DC | PRN
  Administered 2021-05-12: 20 mL

## 2021-05-12 MED ORDER — ALPRAZOLAM 0.5 MG PO TABS: 0.2500 mg | ORAL_TABLET | Freq: Every evening | ORAL | Status: DC | PRN

## 2021-05-12 MED ORDER — EPHEDRINE SULFATE-NACL 50-0.9 MG/10ML-% IV SOSY
PREFILLED_SYRINGE | INTRAVENOUS | Status: DC | PRN
  Administered 2021-05-12: 5 mg via INTRAVENOUS

## 2021-05-12 MED ORDER — ONDANSETRON HCL 4 MG/2ML IJ SOLN
INTRAMUSCULAR | Status: DC | PRN
  Administered 2021-05-12 (×2): 4 mg via INTRAVENOUS

## 2021-05-12 MED ORDER — ROCURONIUM BROMIDE 10 MG/ML (PF) SYRINGE
PREFILLED_SYRINGE | INTRAVENOUS | Status: AC
  Filled 2021-05-12: qty 10

## 2021-05-12 MED ORDER — MIDAZOLAM HCL 2 MG/2ML IJ SOLN
INTRAMUSCULAR | Status: AC
  Filled 2021-05-12: qty 2

## 2021-05-12 MED ORDER — SODIUM CHLORIDE 0.9% FLUSH
3.0000 mL | Freq: Two times a day (BID) | INTRAVENOUS | Status: DC
  Administered 2021-05-12: 3 mL via INTRAVENOUS

## 2021-05-12 MED ORDER — CALCIUM CARBONATE 1250 (500 CA) MG PO TABS
500.0000 mg | ORAL_TABLET | Freq: Every day | ORAL | Status: DC
  Filled 2021-05-12: qty 1

## 2021-05-12 MED ORDER — LIDOCAINE 2% (20 MG/ML) 5 ML SYRINGE
INTRAMUSCULAR | Status: AC
  Filled 2021-05-12: qty 5

## 2021-05-12 MED ORDER — BUPIVACAINE-EPINEPHRINE 0.25% -1:200000 IJ SOLN
INTRAMUSCULAR | Status: DC | PRN
  Administered 2021-05-12: 9 mL
  Administered 2021-05-12: 20 mL

## 2021-05-12 MED ORDER — SENNOSIDES-DOCUSATE SODIUM 8.6-50 MG PO TABS
1.0000 | ORAL_TABLET | Freq: Every evening | ORAL | Status: DC | PRN
  Filled 2021-05-12: qty 1

## 2021-05-12 MED ORDER — POVIDONE-IODINE 7.5 % EX SOLN: Freq: Once | CUTANEOUS | Status: AC

## 2021-05-12 MED ORDER — ACETAMINOPHEN 500 MG PO TABS
1000.0000 mg | ORAL_TABLET | Freq: Once | ORAL | Status: AC
  Administered 2021-05-12: 1000 mg via ORAL
  Filled 2021-05-12: qty 2

## 2021-05-12 MED ORDER — HYDROCODONE-ACETAMINOPHEN 5-325 MG PO TABS: 1.0000 | ORAL_TABLET | ORAL | Status: DC | PRN

## 2021-05-12 MED ORDER — DEXMEDETOMIDINE (PRECEDEX) IN NS 20 MCG/5ML (4 MCG/ML) IV SYRINGE
PREFILLED_SYRINGE | INTRAVENOUS | Status: DC | PRN
  Administered 2021-05-12 (×2): 8 ug via INTRAVENOUS
  Administered 2021-05-12: 4 ug via INTRAVENOUS

## 2021-05-12 MED ORDER — ROCURONIUM BROMIDE 10 MG/ML (PF) SYRINGE
PREFILLED_SYRINGE | INTRAVENOUS | Status: DC | PRN
  Administered 2021-05-12: 30 mg via INTRAVENOUS
  Administered 2021-05-12: 20 mg via INTRAVENOUS
  Administered 2021-05-12: 60 mg via INTRAVENOUS
  Administered 2021-05-12: 20 mg via INTRAVENOUS
  Administered 2021-05-12: 40 mg via INTRAVENOUS

## 2021-05-12 MED ORDER — FENTANYL CITRATE (PF) 250 MCG/5ML IJ SOLN
INTRAMUSCULAR | Status: AC
  Filled 2021-05-12: qty 5

## 2021-05-12 MED ORDER — POTASSIUM CHLORIDE IN NACL 20-0.9 MEQ/L-% IV SOLN: INTRAVENOUS | Status: DC

## 2021-05-12 MED ORDER — ONDANSETRON HCL 4 MG PO TABS: 4.0000 mg | ORAL_TABLET | Freq: Four times a day (QID) | ORAL | Status: DC | PRN

## 2021-05-12 MED ORDER — PHENOL 1.4 % MT LIQD: 1.0000 | OROMUCOSAL | Status: DC | PRN

## 2021-05-12 MED ORDER — ESCITALOPRAM OXALATE 10 MG PO TABS
10.0000 mg | ORAL_TABLET | Freq: Every day | ORAL | Status: DC
  Filled 2021-05-12 (×2): qty 1

## 2021-05-12 MED ORDER — SODIUM CHLORIDE 0.9% FLUSH: 3.0000 mL | INTRAVENOUS | Status: DC | PRN

## 2021-05-12 MED ORDER — FENTANYL CITRATE (PF) 250 MCG/5ML IJ SOLN
INTRAMUSCULAR | Status: DC | PRN
  Administered 2021-05-12: 50 ug via INTRAVENOUS
  Administered 2021-05-12: 100 ug via INTRAVENOUS

## 2021-05-12 MED ORDER — LACTATED RINGERS IV SOLN: INTRAVENOUS | Status: DC

## 2021-05-12 MED ORDER — PHENYLEPHRINE 40 MCG/ML (10ML) SYRINGE FOR IV PUSH (FOR BLOOD PRESSURE SUPPORT)
PREFILLED_SYRINGE | INTRAVENOUS | Status: DC | PRN
  Administered 2021-05-12 (×4): 160 ug via INTRAVENOUS

## 2021-05-12 MED ORDER — FLUTICASONE PROPIONATE 0.05 % EX CREA: 1.0000 "application " | TOPICAL_CREAM | Freq: Every day | CUTANEOUS | Status: DC | PRN

## 2021-05-12 MED ORDER — OXYCODONE-ACETAMINOPHEN 5-325 MG PO TABS
1.0000 | ORAL_TABLET | ORAL | Status: DC | PRN
  Administered 2021-05-12: 1 via ORAL
  Administered 2021-05-12: 2 via ORAL
  Administered 2021-05-13 (×3): 1 via ORAL
  Filled 2021-05-12: qty 1
  Filled 2021-05-12: qty 2
  Filled 2021-05-12 (×3): qty 1

## 2021-05-12 MED ORDER — KETAMINE HCL 10 MG/ML IJ SOLN
INTRAMUSCULAR | Status: DC | PRN
  Administered 2021-05-12: 30 mg via INTRAVENOUS
  Administered 2021-05-12: 20 mg via INTRAVENOUS
  Administered 2021-05-12 (×2): 10 mg via INTRAVENOUS

## 2021-05-12 MED ORDER — CHLORHEXIDINE GLUCONATE 0.12 % MT SOLN
15.0000 mL | Freq: Once | OROMUCOSAL | Status: AC
  Administered 2021-05-12: 15 mL via OROMUCOSAL
  Filled 2021-05-12: qty 15

## 2021-05-12 MED ORDER — 0.9 % SODIUM CHLORIDE (POUR BTL) OPTIME
TOPICAL | Status: DC | PRN
  Administered 2021-05-12: 1000 mL

## 2021-05-12 MED ORDER — THROMBIN 20000 UNITS EX SOLR
CUTANEOUS | Status: AC
  Filled 2021-05-12: qty 20000

## 2021-05-12 MED ORDER — METHOCARBAMOL 500 MG PO TABS
500.0000 mg | ORAL_TABLET | Freq: Four times a day (QID) | ORAL | Status: DC | PRN
  Administered 2021-05-12 – 2021-05-13 (×3): 500 mg via ORAL
  Filled 2021-05-12 (×3): qty 1

## 2021-05-12 MED ORDER — VASOPRESSIN 20 UNIT/ML IV SOLN
INTRAVENOUS | Status: AC
  Filled 2021-05-12: qty 1

## 2021-05-12 MED ORDER — LIDOCAINE 2% (20 MG/ML) 5 ML SYRINGE
INTRAMUSCULAR | Status: DC | PRN
  Administered 2021-05-12: 100 mg via INTRAVENOUS

## 2021-05-12 MED ORDER — CEFAZOLIN SODIUM-DEXTROSE 2-4 GM/100ML-% IV SOLN
2.0000 g | INTRAVENOUS | Status: AC
  Administered 2021-05-12 (×2): 2 g via INTRAVENOUS
  Filled 2021-05-12: qty 100

## 2021-05-12 MED ORDER — ADULT MULTIVITAMIN W/MINERALS CH
1.0000 | ORAL_TABLET | Freq: Every day | ORAL | Status: DC
  Administered 2021-05-12: 1 via ORAL
  Filled 2021-05-12 (×2): qty 1

## 2021-05-12 MED ORDER — ACETAMINOPHEN 10 MG/ML IV SOLN: 1000.0000 mg | Freq: Once | INTRAVENOUS | Status: DC | PRN

## 2021-05-12 MED ORDER — PROPOFOL 500 MG/50ML IV EMUL
INTRAVENOUS | Status: DC | PRN
  Administered 2021-05-12: 25 ug/kg/min via INTRAVENOUS

## 2021-05-12 MED ORDER — PHENYLEPHRINE HCL-NACL 20-0.9 MG/250ML-% IV SOLN
INTRAVENOUS | Status: DC | PRN
  Administered 2021-05-12: 30 ug/min via INTRAVENOUS

## 2021-05-12 MED ORDER — ONDANSETRON HCL 4 MG PO TABS
4.0000 mg | ORAL_TABLET | Freq: Three times a day (TID) | ORAL | Status: DC | PRN
  Administered 2021-05-13: 4 mg via ORAL
  Filled 2021-05-12 (×2): qty 1

## 2021-05-12 MED ORDER — MIDAZOLAM HCL 2 MG/2ML IJ SOLN
INTRAMUSCULAR | Status: DC | PRN
  Administered 2021-05-12: 2 mg via INTRAVENOUS

## 2021-05-12 MED ORDER — DEXAMETHASONE SODIUM PHOSPHATE 10 MG/ML IJ SOLN
INTRAMUSCULAR | Status: AC
  Filled 2021-05-12: qty 1

## 2021-05-12 MED ORDER — THROMBIN 20000 UNITS EX SOLR
CUTANEOUS | Status: DC | PRN
  Administered 2021-05-12: 20000 [IU] via TOPICAL

## 2021-05-12 MED ORDER — ALUM & MAG HYDROXIDE-SIMETH 200-200-20 MG/5ML PO SUSP: 30.0000 mL | Freq: Four times a day (QID) | ORAL | Status: DC | PRN

## 2021-05-12 SURGICAL SUPPLY — 118 items
AGENT HMST KT MTR STRL THRMB (HEMOSTASIS)
APL SKNCLS STERI-STRIP NONHPOA (GAUZE/BANDAGES/DRESSINGS) ×4
APPLIER CLIP 11 MED OPEN (CLIP) ×6
APR CLP MED 11 20 MLT OPN (CLIP) ×4
BAG COUNTER SPONGE SURGICOUNT (BAG) ×7 IMPLANT
BAG SPNG CNTER NS LX DISP (BAG) ×2
BENZOIN TINCTURE PRP APPL 2/3 (GAUZE/BANDAGES/DRESSINGS) ×4 IMPLANT
BLADE CLIPPER SURG (BLADE) ×2 IMPLANT
BLADE SURG 10 STRL SS (BLADE) ×5 IMPLANT
BONE VIVIGEN FORMABLE 5.4CC (Bone Implant) ×3 IMPLANT
BUR PRESCISION 1.7 ELITE (BURR) ×3 IMPLANT
BUR ROUND FLUTED 5 RND (BURR) ×3 IMPLANT
BUR ROUND PRECISION 4.0 (BURR) IMPLANT
BUR SABER RD CUTTING 3.0 (BURR) IMPLANT
CLIP APPLIE 11 MED OPEN (CLIP) ×4 IMPLANT
CLIP LIGATING EXTRA MED SLVR (CLIP) ×3 IMPLANT
CNTNR URN SCR LID CUP LEK RST (MISCELLANEOUS) ×2 IMPLANT
CONT SPEC 4OZ STRL OR WHT (MISCELLANEOUS) ×3
CORD BIPOLAR FORCEPS 12FT (ELECTRODE) ×3 IMPLANT
COVER MAYO STAND STRL (DRAPES) ×6 IMPLANT
COVER SURGICAL LIGHT HANDLE (MISCELLANEOUS) ×6 IMPLANT
DRAPE C-ARM 35X43 STRL (DRAPES) ×4 IMPLANT
DRAPE C-ARM 42X72 X-RAY (DRAPES) ×9 IMPLANT
DRAPE C-ARMOR (DRAPES) ×2 IMPLANT
DRAPE POUCH INSTRU U-SHP 10X18 (DRAPES) ×6 IMPLANT
DRAPE SURG 17X23 STRL (DRAPES) ×21 IMPLANT
DURAPREP 26ML APPLICATOR (WOUND CARE) ×6 IMPLANT
ELECT BLADE 4.0 EZ CLEAN MEGAD (MISCELLANEOUS) ×6
ELECT CAUTERY BLADE 6.4 (BLADE) ×6 IMPLANT
ELECT REM PT RETURN 9FT ADLT (ELECTROSURGICAL) ×6
ELECTRODE BLDE 4.0 EZ CLN MEGD (MISCELLANEOUS) ×6 IMPLANT
ELECTRODE REM PT RTRN 9FT ADLT (ELECTROSURGICAL) ×4 IMPLANT
FILTER STRAW FLUID ASPIR (MISCELLANEOUS) ×2 IMPLANT
GAUZE 4X4 16PLY ~~LOC~~+RFID DBL (SPONGE) ×4 IMPLANT
GAUZE SPONGE 4X4 12PLY STRL (GAUZE/BANDAGES/DRESSINGS) ×4 IMPLANT
GLOVE SRG 8 PF TXTR STRL LF DI (GLOVE) ×6 IMPLANT
GLOVE SURG ENC MOIS LTX SZ6.5 (GLOVE) ×6 IMPLANT
GLOVE SURG ENC MOIS LTX SZ8 (GLOVE) ×6 IMPLANT
GLOVE SURG NONLX 8.0 ULT (GLOVE) ×3 IMPLANT
GLOVE SURG POLYISO LF SZ7 (GLOVE) ×1 IMPLANT
GLOVE SURG UNDER POLY LF SZ7 (GLOVE) ×7 IMPLANT
GLOVE SURG UNDER POLY LF SZ8 (GLOVE) ×9
GOWN STRL REUS W/ TWL LRG LVL3 (GOWN DISPOSABLE) ×8 IMPLANT
GOWN STRL REUS W/ TWL XL LVL3 (GOWN DISPOSABLE) ×6 IMPLANT
GOWN STRL REUS W/TWL LRG LVL3 (GOWN DISPOSABLE) ×12
GOWN STRL REUS W/TWL XL LVL3 (GOWN DISPOSABLE) ×9
GRAFT BNE MATRIX VG FRMBL MD 5 (Bone Implant) IMPLANT
GUIDEWIRE SHARP VIPER II (WIRE) ×4 IMPLANT
INSERT FOGARTY 61MM (MISCELLANEOUS) IMPLANT
INSERT FOGARTY SM (MISCELLANEOUS) IMPLANT
IV CATH 14GX2 1/4 (CATHETERS) ×3 IMPLANT
KIT BASIN OR (CUSTOM PROCEDURE TRAY) ×6 IMPLANT
KIT POSITION SURG JACKSON T1 (MISCELLANEOUS) ×3 IMPLANT
KIT TURNOVER KIT B (KITS) ×6 IMPLANT
MARKER SKIN DUAL TIP RULER LAB (MISCELLANEOUS) ×6 IMPLANT
NDL HYPO 25GX1X1/2 BEV (NEEDLE) ×4 IMPLANT
NDL SPNL 18GX3.5 QUINCKE PK (NEEDLE) ×6 IMPLANT
NDL SPNL 20GX3.5 QUINCKE YW (NEEDLE) IMPLANT
NEEDLE 22X1 1/2 (OR ONLY) (NEEDLE) ×5 IMPLANT
NEEDLE HYPO 25GX1X1/2 BEV (NEEDLE) ×3 IMPLANT
NEEDLE SPNL 18GX3.5 QUINCKE PK (NEEDLE) ×9 IMPLANT
NEEDLE SPNL 20GX3.5 QUINCKE YW (NEEDLE) ×3 IMPLANT
NS IRRIG 1000ML POUR BTL (IV SOLUTION) ×6 IMPLANT
PACK LAMINECTOMY ORTHO (CUSTOM PROCEDURE TRAY) ×6 IMPLANT
PACK UNIVERSAL I (CUSTOM PROCEDURE TRAY) ×6 IMPLANT
PAD ARMBOARD 7.5X6 YLW CONV (MISCELLANEOUS) ×18 IMPLANT
PENCIL BUTTON HOLSTER BLD 10FT (ELECTRODE) ×1 IMPLANT
PENCIL SMOKE EVACUATOR (MISCELLANEOUS) ×3 IMPLANT
ROD VIPER II LORDOSED 5.5X40 (Rod) ×1 IMPLANT
ROD VIPER2 5.5X45 PRE LARDOSED (Rod) ×1 IMPLANT
SCREW POLY VIPER2 7X40MM (Screw) ×2 IMPLANT
SCREW SET SINGLE INNER MIS (Screw) ×4 IMPLANT
SCREW XTAB POLY VIPER  6X45 (Screw) ×6 IMPLANT
SCREW XTAB POLY VIPER 6X45 (Screw) IMPLANT
SPACER ALIF MAGNIFY 31X10X25 8 (Spacer) ×1 IMPLANT
SPONGE INTESTINAL PEANUT (DISPOSABLE) ×10 IMPLANT
SPONGE SURGIFOAM ABS GEL 100 (HEMOSTASIS) ×7 IMPLANT
SPONGE T-LAP 18X18 ~~LOC~~+RFID (SPONGE) ×3 IMPLANT
SPONGE T-LAP 4X18 ~~LOC~~+RFID (SPONGE) ×6 IMPLANT
STRIP CLOSURE SKIN 1/2X4 (GAUZE/BANDAGES/DRESSINGS) ×6 IMPLANT
SURGIFLO W/THROMBIN 8M KIT (HEMOSTASIS) IMPLANT
SUT MNCRL AB 4-0 PS2 18 (SUTURE) ×6 IMPLANT
SUT PDS AB 1 CTX 36 (SUTURE) ×9 IMPLANT
SUT PROLENE 4 0 RB 1 (SUTURE)
SUT PROLENE 4-0 RB1 .5 CRCL 36 (SUTURE) IMPLANT
SUT PROLENE 5 0 C 1 24 (SUTURE) IMPLANT
SUT PROLENE 5 0 CC1 (SUTURE) IMPLANT
SUT PROLENE 6 0 BV (SUTURE) ×6 IMPLANT
SUT PROLENE 6 0 C 1 30 (SUTURE) ×3 IMPLANT
SUT SILK 0 TIES 10X30 (SUTURE) ×3 IMPLANT
SUT SILK 2 0 TIES 10X30 (SUTURE) ×6 IMPLANT
SUT SILK 2 0SH CR/8 30 (SUTURE) IMPLANT
SUT SILK 3 0 TIES 10X30 (SUTURE) ×3 IMPLANT
SUT SILK 3 0 TIES 17X18 (SUTURE) ×3
SUT SILK 3 0SH CR/8 30 (SUTURE) IMPLANT
SUT SILK 3-0 18XBRD TIE BLK (SUTURE) ×2 IMPLANT
SUT VIC AB 0 CT1 18XCR BRD 8 (SUTURE) ×2 IMPLANT
SUT VIC AB 0 CT1 8-18 (SUTURE) ×3
SUT VIC AB 1 CT1 18XCR BRD 8 (SUTURE) ×2 IMPLANT
SUT VIC AB 1 CT1 27 (SUTURE) ×6
SUT VIC AB 1 CT1 27XBRD ANBCTR (SUTURE) ×4 IMPLANT
SUT VIC AB 1 CT1 8-18 (SUTURE) ×6
SUT VIC AB 1 CTX 36 (SUTURE) ×6
SUT VIC AB 1 CTX36XBRD ANBCTR (SUTURE) ×4 IMPLANT
SUT VIC AB 2-0 CT2 18 VCP726D (SUTURE) ×8 IMPLANT
SYR 20ML LL LF (SYRINGE) ×6 IMPLANT
SYR BULB IRRIG 60ML STRL (SYRINGE) ×6 IMPLANT
SYR CONTROL 10ML LL (SYRINGE) ×6 IMPLANT
SYR TB 1ML LUER SLIP (SYRINGE) ×3 IMPLANT
TAP CANN VIPER2 DL 6.0 (TAP) ×2 IMPLANT
TAP CANN VIPER2 DL 7.0 (TAP) ×1 IMPLANT
TOWEL GREEN STERILE (TOWEL DISPOSABLE) ×6 IMPLANT
TOWEL GREEN STERILE FF (TOWEL DISPOSABLE) ×3 IMPLANT
TRAY FOLEY MTR SLVR 16FR STAT (SET/KITS/TRAYS/PACK) ×3 IMPLANT
TRAY FOLEY W/BAG SLVR 16FR (SET/KITS/TRAYS/PACK) ×3
TRAY FOLEY W/BAG SLVR 16FR ST (SET/KITS/TRAYS/PACK) ×2 IMPLANT
WATER STERILE IRR 1000ML POUR (IV SOLUTION) ×6 IMPLANT
YANKAUER SUCT BULB TIP NO VENT (SUCTIONS) ×6 IMPLANT

## 2021-05-12 NOTE — Anesthesia Procedure Notes (Signed)
Procedure Name: Intubation Date/Time: 05/12/2021 7:52 AM Performed by: Tressie Ellis, CRNA Pre-anesthesia Checklist: Patient identified, Emergency Drugs available, Suction available and Patient being monitored Patient Re-evaluated:Patient Re-evaluated prior to induction Oxygen Delivery Method: Circle system utilized Preoxygenation: Pre-oxygenation with 100% oxygen Induction Type: IV induction Ventilation: Mask ventilation without difficulty Laryngoscope Size: Miller and 2 Grade View: Grade I Tube type: Oral Number of attempts: 1 Airway Equipment and Method: Stylet and Oral airway Placement Confirmation: ETT inserted through vocal cords under direct vision, positive ETCO2 and breath sounds checked- equal and bilateral Tube secured with: Tape Dental Injury: Teeth and Oropharynx as per pre-operative assessment

## 2021-05-12 NOTE — Anesthesia Postprocedure Evaluation (Signed)
Anesthesia Post Note  Patient: Kelsey Fitzpatrick  Procedure(s) Performed: LUMBAR 5 - SACRUM 1 ANTERIOR LUMBAR INTERBODY FUSION WITH POSTERIOR SPINAL FUSION WITH INSTRUMENTATION AND ALLOGRAFT (Spine Lumbar) LUMBAR 5 - SACRUM 1 ANTERIOR LUMBAR INTERBODY FUSION WITH POSTERIOR SPINAL FUSION WITH INSTRUMENTATION AND ALLOGRAFT (Spine Lumbar) ABDOMINAL EXPOSURE (Abdomen)     Patient location during evaluation: PACU Anesthesia Type: General Level of consciousness: awake and alert and oriented Pain management: pain level controlled Vital Signs Assessment: post-procedure vital signs reviewed and stable Respiratory status: spontaneous breathing, nonlabored ventilation and respiratory function stable Cardiovascular status: blood pressure returned to baseline Postop Assessment: no apparent nausea or vomiting Anesthetic complications: no   No notable events documented.  Last Vitals:  Vitals:   05/12/21 1430 05/12/21 1512  BP: 114/79 110/72  Pulse: (!) 56 71  Resp: 11 18  Temp: (!) 36.1 C   SpO2: 100% 95%    Last Pain:  Vitals:   05/12/21 1512  PainSc: 2       LLE Sensation: Full sensation (05/12/21 1516)   RLE Sensation: Full sensation (05/12/21 1516)      Marthenia Rolling

## 2021-05-12 NOTE — H&P (Signed)
PREOPERATIVE H&P  Chief Complaint: Bilateral leg pain  HPI: Kelsey Fitzpatrick is a 66 y.o. female who presents with ongoing pain in the bilateral legs and ack  MRI reveals spinal stenosis and instability at L5/S1  Patient has failed multiple forms of conservative care and continues to have pain (see office notes for additional details regarding the patient's full course of treatment)  Past Medical History:  Diagnosis Date   Abnormal Pap smear 2006   ASCUS   Anxiety    Arthritis    OA    Back pain    Cancer ([place])    2012 LT ARM MELANOMA    Chronic kidney disease    Blood in urine   Depression    Encounter for insertion of mirena IUD 2008   Fibroids    Osteopenia    PONV (postoperative nausea and vomiting)    Proteinuria    ever since childhood   Shingles    SI (sacroiliac) joint dysfunction    Sleep apnea    Past Surgical History:  Procedure Laterality Date   BUNIONECTOMY     BILAT 1995    COLONOSCOPY     MULTIPLE TOOTH EXTRACTIONS     SACROILIAC JOINT FUSION  02/14/2012   Procedure: SACROILIAC JOINT FUSION;  Surgeon: Sinclair Ship, MD;  Location: [facility];  Service: Orthopedics;  Laterality: Left;  Left sacroiliac joint fusion   SACROILIAC JOINT FUSION Left 03/03/2020   Procedure: REVISION LEFT SACROILIAC JOINT FUSION;  Surgeon: Phylliss Bob, MD;  Location: [facility];  Service: Orthopedics;  Laterality: Left;   WISDOM TOOTH EXTRACTION     Social History   Socioeconomic History   Marital status: Single    Spouse name: Not on file   Number of children: Not on file   Years of education: Not on file   Highest education level: Not on file  Occupational History   Not on file  Tobacco Use   Smoking status: Never   Smokeless tobacco: Never  Vaping Use   Vaping Use: Never used  Substance and Sexual Activity   Alcohol use: Yes    Comment: occasionally maybe 2x a week   Drug use: No   Sexual activity: Yes    Birth control/protection: None  Other  Topics Concern   Not on file  Social History Narrative   Not on file   Social Determinants of Health   Financial Resource Strain: Not on file  Food Insecurity: Not on file  Transportation Needs: Not on file  Physical Activity: Not on file  Stress: Not on file  Social Connections: Not on file   Family History  Problem Relation Age of Onset   Kidney disease Father    Dementia Mother    Stroke Mother    Rheum arthritis Mother    Allergies  Allergen Reactions   Codeine Nausea Only and Other (See Comments)    Dizziness   Hydrocodone Nausea And Vomiting   Doxycycline Hyclate Rash and Other (See Comments)    Numb in finger and toes   Sertraline Rash and Other (See Comments)   Prior to Admission medications   Medication Sig Start Date End Date Taking? Authorizing Provider  ALPRAZolam Duanne Moron) 0.5 MG tablet Take 0.25 mg by mouth at bedtime as needed for sleep.    Yes [provider]  calcium carbonate (OSCAL) 1500 (600 Ca) MG TABS tablet 600 mg of elemental calcium daily with breakfast.   Yes [provider]  carboxymethylcellulose (REFRESH PLUS) 0.5 % SOLN Place 1 drop into both eyes daily as needed (lub. eye).   Yes [provider]  doxycycline (MONODOX) 50 MG capsule Take 50 mg by mouth daily.   Yes [provider]  escitalopram (LEXAPRO) 10 MG tablet Take 10 mg by mouth daily.   Yes [provider]  ketotifen (ZADITOR) 0.025 % ophthalmic solution Place 1 drop into both eyes 2 (two) times daily as needed (allergies).   Yes [provider]  Multiple Vitamin (M.V.I. ADULT IV) Take 1 tablet by mouth daily.   Yes [provider]  ondansetron (ZOFRAN) 4 MG tablet Take 1 tablet (4 mg total) by mouth every 8 (eight) hours as needed for nausea or vomiting. 03/03/20  Yes McKenzie, Lennie Muckle, PA-C  traMADol (ULTRAM) 50 MG tablet Take 50 mg by mouth every 6 (six) hours as needed for moderate pain.   Yes [provider]   fluticasone (CUTIVATE) 4.09 % cream 1 application daily as needed (itching).    [provider]  methocarbamol (ROBAXIN) 500 MG tablet Take 1 tablet (500 mg total) by mouth every 6 (six) hours as needed for muscle spasms. 03/03/20   McKenzie, Lennie Muckle, PA-C     All other systems have been reviewed and were otherwise negative with the exception of those mentioned in the HPI and as above.  Physical Exam: There were no vitals filed for this visit.  There is no height or weight on file to calculate BMI.  General: Alert, no acute distress Cardiovascular: No pedal edema Respiratory: No cyanosis, no use of accessory musculature Skin: No lesions in the area of chief complaint Neurologic: Sensation intact distally Psychiatric: Patient is competent for consent with normal mood and affect Lymphatic: No axillary or cervical lymphadenopathy   Assessment/Plan: SPONDYLOLISTHESIS AND SPINAL STENOSIS AT L5/S1 Plan for Procedure(s): LUMBAR 5 - SACRUM 1 ANTERIOR LUMBAR INTERBODY FUSION WITH POSTERIOR SPINAL FUSION WITH INSTRUMENTATION AND ALLOGRAFT, ABDOMINAL EXPOSURE   Norva Karvonen, MD 05/12/2021 6:24 AM

## 2021-05-12 NOTE — Transfer of Care (Signed)
Immediate Anesthesia Transfer of Care Note  Patient: Kelsey Fitzpatrick  Procedure(s) Performed: LUMBAR 5 - SACRUM 1 ANTERIOR LUMBAR INTERBODY FUSION WITH POSTERIOR SPINAL FUSION WITH INSTRUMENTATION AND ALLOGRAFT (Spine Lumbar) LUMBAR 5 - SACRUM 1 ANTERIOR LUMBAR INTERBODY FUSION WITH POSTERIOR SPINAL FUSION WITH INSTRUMENTATION AND ALLOGRAFT (Spine Lumbar) ABDOMINAL EXPOSURE (Abdomen)  Patient Location: PACU  Anesthesia Type:General  Level of Consciousness: awake, alert  and oriented  Airway & Oxygen Therapy: Patient Spontanous Breathing and Patient connected to nasal cannula oxygen  Post-op Assessment: Report given to RN and Post -op Vital signs reviewed and stable  Post vital signs: Reviewed and stable  Last Vitals:  Vitals Value Taken Time  BP 106/66 05/12/21 1247  Temp    Pulse 63 05/12/21 1253  Resp 12 05/12/21 1253  SpO2 100 % 05/12/21 1253  Vitals shown include unvalidated device data.  Last Pain:  Vitals:   05/12/21 0611  PainSc: 3       Patients Stated Pain Goal: 3 (63/14/97 0263)  Complications: No notable events documented.

## 2021-05-12 NOTE — Op Note (Signed)
PATIENT NAME: Kelsey Fitzpatrick RECORD NO.:   [id number]    DATE OF BIRTH: 03/26/1956   DATE OF PROCEDURE: 05/12/2021                               OPERATIVE REPORT   PREOPERATIVE DIAGNOSES: 1. Axial low back pain. 2. Bilateral lumbar radiculopathy. 3. L5-S1 degenerative disk disease. 4. L5/S1 spondylolisthesis (M43.17, M53.2X7) 5. L5/S1 spinal stenosis  POSTOPERATIVE DIAGNOSES: 1. Axial low back pain. 2. Bilateral lumbar radiculopathy. 3. L5-S1 degenerative disk disease. 4. L5/S1 spondylolisthesis (M43.17, M53.2X7) 5. L5/S1 spinal stenosis  PROCEDURE: 1. Anterior lumbar interbody fusion, L5-S1 2. Insertion of interbody device x 1 (Globus expandable intervertebral spacers). 3. Intraoperative use of fluoroscopy. 4. Posterior spinal fusion, L5-S1. 5. Placement of posterior instrumentation, L5, S1. 6. Use of morselized (Vivigen) 7. Retroperitoneal exposure to L5/S1 (with Dr. Marjean Donna as co-surgeon)  SURGEON:  Phylliss Bob, MD  ASSISTANT:  Pricilla Holm, PA-C  ANESTHESIA:  General endotracheal anesthesia.  COMPLICATIONS:  None.  DISPOSITION:  Stable.  ESTIMATED BLOOD LOSS:  200 mL.  INDICATIONS FOR SURGERY:  Briefly, Kelsey Fitzpatrick is a very pleasant 66 year old female, who did present to me with severe and debilitating pain in both her back and bilateral legs.  The patient's MRI was notable for the findings outlined above.  The patient is failed multiple nonoperative measures, including various injections and physical therapy.  Given her ongoing pain and dysfunction, we did discuss proceeding with the procedure noted above.  The patient was fully aware that surgery may or may not alleviate her ongoing symptoms, particularly her low back pain. The patient did elect to proceed.  OPERATIVE DETAILS:  On 05/12/2021, the patient was brought to surgery and general endotracheal anesthesia was administered.  The patient was placed supine on the hospital bed.  The  patient's abdomen was prepped and draped in the usual sterile fashion.  An anterior retroperitoneal approach was then performed by myself, and Dr. Marjean Donna, the details of which will dictated on a separate note. I did function as his cosurgeon during the approach.  Once the anterior lumbar spine was noted, we did focus our attention on the L5-S1 intervertebral space.  I then performed a thorough and complete L5-S1 intervertebral diskectomy to the level of the posterior longitudinal ligament.  I was very pleased with the diskectomy that I was able to accomplish.  The endplates were then appropriately prepared and the appropriate sized anterior intervertebral spacer was packed with Vivigen and tamped into position. The intervertebral spacer was then expanded to approximately 12 mm in height. I was very pleased with the press-fit of the implant.  I was very pleased with the final AP and lateral fluoroscopic images.  The wound was copiously irrigated.  The fascia was closed using #1 PDS.  The subcutaneous layer was closed using 0 Vicryl followed by 2-0 Vicryl, and the skin was closed using 4-0 Monocryl. Benzoin and Steri-Strips were applied followed by sterile dressing.    The patient was then rolled prone onto a Jackson spinal bed.  The back was then prepped and draped in the usual sterile fashion.  I then made paramedian incisions on the right and left sides, just lateral to the lateral borders of the pedicles of L5 and S1.  On the left side, the posterolateral gutter and posterior elements were identified and exposed and decorticated.  The remainder of the Vivigen was packed  into the posterolateral gutter on the left side to aid in the success of the posterior fusion.  I then tapped the L5, and S1 pedicles bilaterally up to a 7 mm tap.  I then placed 6 x 45 mm screws bilaterally at L5, and 7 x 40 mm screws bilaterally S1.  Rods were then secured into the tulip heads of the screws  bilaterally.  Caps were then placed and a final locking procedure was performed.  I was very pleased with the final AP and lateral fluoroscopic images.  The wound was then copiously irrigated.  On the right and left sides, the fascia was closed using #1 Vicryl.  The subcutaneous layer was closed using 0 Vicryl followed by 2-0 Vicryl, and the skin was then closed using 4-0 Monocryl. Benzoin and Steri-Strips were applied followed by sterile dressing.  All instrument counts were correct at the termination of the procedure.  Of note, Pricilla Holm was my assistant throughout surgery, and did aid in retraction, placement of the hardware, suctioning, and closure for both the anterior and posterior portions of the procedure.   Phylliss Bob, MD

## 2021-05-12 NOTE — Op Note (Signed)
DATE OF SERVICE: 05/12/2021  PATIENT:  Kelsey Fitzpatrick  66 y.o. female  PRE-OPERATIVE DIAGNOSIS:  L5/S1 spondylolsthesis  POST-OPERATIVE DIAGNOSIS:  Same  PROCEDURE:   Anterior spinal exposure L5/S1 disc space  SURGEON:  Surgeon(s) and Role: Panel 1:    Phylliss Bob, MD - Primary Panel 2:    * Cherre Robins, MD - Primary  ASSISTANT: Pricilla Holm, PA-C  An assistant was required to facilitate exposure and expedite the case.  ANESTHESIA:   general  EBL: minimal from my portion of procedure  BLOOD ADMINISTERED:none  DRAINS: none   LOCAL MEDICATIONS USED:  none   SPECIMEN: None from my portion of procedure  COUNTS: Not performed, will be performed at end of case.  TOURNIQUET: None  PATIENT DISPOSITION:   Per Dr. Lynann Bologna   Delay start of Pharmacological VTE agent (>24hrs) due to surgical blood loss or risk of bleeding: no  INDICATION FOR PROCEDURE: Kelsey Fitzpatrick is a 66 y.o. female with L5/S1 spondylolisthesis.  Dr. Lynann Bologna had planned to perform an L5/S1 anterior lumbar interbody fixation.  He asked me for assistance with anterior exposure of the space.  After careful discussion of risks, benefits, and alternatives the patient was offered anterior exposure. We specifically discussed small risk of vascular injury, ureteral injury, ileus. The patient  understood and wished to proceed.  OPERATIVE FINDINGS: Unremarkable retroperitoneal exposure of the L5/S1 disc space.  DESCRIPTION OF PROCEDURE: After identification of the patient in the pre-operative holding area, the patient was transferred to the operating room. The patient was positioned supine on the operating room table. Anesthesia was induced. The abdomen was prepped and draped in standard fashion. A surgical pause was performed confirming correct patient, procedure, and operative location.  Using intraoperative fluoroscopy and a ring forcep, the skin of the suprapubic abdomen overlying the L5/S1 disc  base was marked.  Transverse incision was planned.  The incision was made with a 10 blade.  Incision was carried down through subtenons tissue using Bovie electrocautery.  A longitudinal incision in the anterior rectus fascia was then made.  The rectus muscle was skeletonized and encircled.  I entered the retroperitoneal space bluntly.  I developed the retroperitoneal plane.  I visualized the psoas muscle and continued exposure into the midline.  The left iliac arteries were identified and protected.  The right common iliac vein was identified and protected.  Using a combination of sharp and blunt dissection we identified the L5/S1 disc base and skeletonized this anteriorly in all cardinal directions.  A self-retaining retractor system was brought onto the field and positioned.  Laced a spinal needle into the L5/S1 disc base and confirmed the correct level.  At this point Dr. Lynann Bologna joined the case and visually confirmed he had adequate exposure.  At this point the case was turned over to Dr. Lynann Bologna who will dictate the remainder of the operative details.  Yevonne Aline. Stanford Breed, MD [facility] Phone Number: [phone] 05/12/2021 9:38 AM

## 2021-05-12 NOTE — Anesthesia Procedure Notes (Signed)
Arterial Line Insertion Start/End1/26/2023 7:55 AM, 05/12/2021 8:02 AM Performed by: Imagene Riches, CRNA, CRNA  Patient location: OR. Preanesthetic checklist: patient identified, IV checked, site marked, risks and benefits discussed, surgical consent, monitors and equipment checked, pre-op evaluation, timeout performed and anesthesia consent Right, radial was placed Catheter size: 20 G Hand hygiene performed  and maximum sterile barriers used  Allen's test indicative of satisfactory collateral circulation Attempts: 1 Procedure performed without using ultrasound guided technique. Following insertion, dressing applied and Biopatch. Post procedure assessment: normal  Patient tolerated the procedure well with no immediate complications.

## 2021-05-12 NOTE — Interval H&P Note (Signed)
History and Physical Interval Note:  05/12/2021 7:19 AM  Kelsey Fitzpatrick  has presented today for surgery, with the diagnosis of SPONDYLOLISTHESIS.  The various methods of treatment have been discussed with the patient and family. After consideration of risks, benefits and other options for treatment, the patient has consented to  Procedure(s): LUMBAR 5 - SACRUM 1 ANTERIOR LUMBAR INTERBODY FUSION WITH POSTERIOR SPINAL FUSION WITH INSTRUMENTATION AND ALLOGRAFT (N/A) LUMBAR 5 - SACRUM 1 ANTERIOR LUMBAR INTERBODY FUSION WITH POSTERIOR SPINAL FUSION WITH INSTRUMENTATION AND ALLOGRAFT (N/A) ABDOMINAL EXPOSURE (N/A) as a surgical intervention.  The patient's history has been reviewed, patient examined, no change in status, stable for surgery.  I have reviewed the patient's chart and labs.  Questions were answered to the patient's satisfaction.     Cherre Robins

## 2021-05-13 ENCOUNTER — Encounter (HOSPITAL_COMMUNITY): Payer: Self-pay | Admitting: Orthopedic Surgery

## 2021-05-13 MED ORDER — OXYCODONE-ACETAMINOPHEN 5-325 MG PO TABS
1.0000 | ORAL_TABLET | ORAL | 0 refills | Status: DC | PRN
Start: 2021-05-13 — End: 2021-05-29

## 2021-05-13 NOTE — Evaluation (Signed)
Physical Therapy Evaluation Patient Details Name: Kelsey Fitzpatrick MRN: [id number] DOB: [birth year] Today's Date: 05/13/2021  History of Present Illness  Pt is a 66 y/o female who presents s/p anterior lumbar interbody fusion, L5-S1 and posterior spinal fusion, L5-S1 on 05/12/2021. PMH significant for skin CA, CKD, Left SI joint fusion 2013 with revision in 2021.   Clinical Impression  Pt admitted with above diagnosis. At the time of PT eval, pt was able to demonstrate transfers and ambulation with gross supervision for safety with RW for support. Without walker, pt at a min guard level. Pt was educated on precautions, brace application/wearing schedule, appropriate activity progression, and car transfer. Pt currently with functional limitations due to the deficits listed below (see PT Problem List). Pt will benefit from skilled PT to increase their independence and safety with mobility to allow discharge to the venue listed below.         Recommendations for follow up therapy are one component of a multi-disciplinary discharge planning process, led by the attending physician.  Recommendations may be updated based on patient status, additional functional criteria and insurance authorization.  Follow Up Recommendations No PT follow up    Assistance Recommended at Discharge PRN  Patient can return home with the following  A little help with walking and/or transfers;Assist for transportation;Help with stairs or ramp for entrance    Equipment Recommendations Rolling walker (2 wheels)  Recommendations for Other Services       Functional Status Assessment Patient has had a recent decline in their functional status and demonstrates the ability to make significant improvements in function in a reasonable and predictable amount of time.     Precautions / Restrictions Precautions Precautions: Back;Fall Precaution Booklet Issued: Yes (comment) Required Braces or Orthoses: Spinal Brace Spinal  Brace: Thoracolumbosacral orthotic;Applied in sitting position Restrictions Weight Bearing Restrictions: No      Mobility  Bed Mobility Overal bed mobility: Modified Independent             General bed mobility comments: HOB flat and min use of rails required. Overall good technique.    Transfers Overall transfer level: Modified independent Equipment used: Rolling walker (2 wheels)               General transfer comment: Good posture as pt powered up to full stand. VC's for hand placement on seated surface for safety.    Ambulation/Gait Ambulation/Gait assistance: Supervision, Min guard Gait Distance (Feet): 250 Feet Assistive device: Rolling walker (2 wheels), None Gait Pattern/deviations: Step-through pattern, Decreased stride length, Trunk flexed Gait velocity: Decreased Gait velocity interpretation: <1.31 ft/sec, indicative of household ambulator   General Gait Details: Slow and guarded due to pain. Pt ambulating fairly well with the RW and wanted to try without an AD. Min guard provided without RW. Pt states she feels more comfortable and prefers the walker at this time.  Stairs Stairs: Yes Stairs assistance: Min guard Stair Management: One rail Right, Step to pattern, Forwards Number of Stairs: 3 General stair comments: VC's for sequencing and general safety. No assist required however close guard provided as pt reports she is lightheaded.  Wheelchair Mobility    Modified Rankin (Stroke Patients Only)       Balance Overall balance assessment: Mild deficits observed, not formally tested  Pertinent Vitals/Pain Pain Assessment Pain Assessment: Faces Faces Pain Scale: Hurts a little bit Pain Location: incisonal Pain Descriptors / Indicators: Burning Pain Intervention(s): Limited activity within patient's tolerance, Monitored during session, Repositioned    Home Living Family/patient  expects to be discharged to:: Private residence Living Arrangements: Alone Available Help at Discharge: Family;Available 24 hours/day (several days) Type of Home: House Home Access: Stairs to enter Entrance Stairs-Rails: Chemical engineer of Steps: 3   Home Layout: One level Home Equipment: Chartered certified accountant Additional Comments: Adjustable bed    Prior Function Prior Level of Function : Independent/Modified Independent                     Hand Dominance   Dominant Hand: Right    Extremity/Trunk Assessment   Upper Extremity Assessment Upper Extremity Assessment: Defer to OT evaluation    Lower Extremity Assessment Lower Extremity Assessment: Generalized weakness (Consistent with pre-op diagnosis)    Cervical / Trunk Assessment Cervical / Trunk Assessment: Back Surgery  Communication   Communication: No difficulties  Cognition Arousal/Alertness: Awake/alert Behavior During Therapy: WFL for tasks assessed/performed Overall Cognitive Status: Within Functional Limits for tasks assessed                                          General Comments General comments (skin integrity, edema, etc.): tenative when up on feet without brace on, with brace on moves more readily    Exercises     Assessment/Plan    PT Assessment Patient needs continued PT services  PT Problem List Decreased strength;Decreased activity tolerance;Decreased mobility;Decreased balance;Decreased knowledge of use of DME;Decreased safety awareness;Decreased knowledge of precautions;Pain       PT Treatment Interventions DME instruction;Gait training;Stair training;Functional mobility training;Therapeutic activities;Balance training;Neuromuscular re-education;Patient/family education    PT Goals (Current goals can be found in the Care Plan section)  Acute Rehab PT Goals Patient Stated Goal: Home today PT Goal Formulation: With patient/family Time For Goal  Achievement: 05/20/21 Potential to Achieve Goals: Good    Frequency Min 5X/week     Co-evaluation               AM-PAC PT "6 Clicks" Mobility  Outcome Measure Help needed turning from your back to your side while in a flat bed without using bedrails?: None Help needed moving from lying on your back to sitting on the side of a flat bed without using bedrails?: None Help needed moving to and from a bed to a chair (including a wheelchair)?: A Little Help needed standing up from a chair using your arms (e.g., wheelchair or bedside chair)?: A Little Help needed to walk in hospital room?: A Little Help needed climbing 3-5 steps with a railing? : A Little 6 Click Score: 20    End of Session Equipment Utilized During Treatment: Gait belt;Back brace Activity Tolerance: Patient tolerated treatment well Patient left: in bed;with call bell/phone within reach;with family/visitor present Nurse Communication: Mobility status PT Visit Diagnosis: Unsteadiness on feet (R26.81);Pain Pain - part of body:  (back)    Time: 1610-9604 PT Time Calculation (min) (ACUTE ONLY): 29 min   Charges:   PT Evaluation $PT Eval Low Complexity: 1 Low PT Treatments $Gait Training: 8-22 mins        Rolinda Roan, PT, DPT Acute Rehabilitation Services Pager: [phone] Office: [phone]   Thelma Comp 05/13/2021, 9:47 AM

## 2021-05-13 NOTE — Progress Notes (Signed)
° ° °  Patient doing well  Patient denies leg pain Has been ambualting   Physical Exam: Vitals:   05/12/21 2011 05/13/21 0003  BP: 102/65 98/60  Pulse: 66 84  Resp: 16 18  Temp: 99 F (37.2 C) 98.6 F (37 C)  SpO2: 96% 98%    Dressing in place NVI  POD #1 s/p L5/S1 fusion, doing well  - up with PT/OT, encourage ambulation - Percocet for pain, Robaxin for muscle spasms - d/c home today with f/u in 2 weeks

## 2021-05-13 NOTE — Evaluation (Signed)
Occupational Therapy Evaluation and Discharge Patient Details Name: Ski Polich MRN: [id number] DOB: [birth year] Today's Date: 05/13/2021   History of Present Illness Pt is a 66 yo female now s/p anterior lumbar interbody fusion, L5-S1 and posterior spinal fusion, L5-S.   Clinical Impression   This 66 yo female admitted and underwent above presents to acute OT with all education completed, we will D/C from acute OT.      Recommendations for follow up therapy are one component of a multi-disciplinary discharge planning process, led by the attending physician.  Recommendations may be updated based on patient status, additional functional criteria and insurance authorization.   Follow Up Recommendations  No OT follow up    Assistance Recommended at Discharge PRN  Patient can return home with the following Assistance with cooking/housework    Functional Status Assessment  Patient has had a recent decline in their functional status and demonstrates the ability to make significant improvements in function in a reasonable and predictable amount of time. (without need for followup OT)  Equipment Recommendations  None recommended by OT       Precautions / Restrictions Precautions Precautions: Back Precaution Booklet Issued: Yes (comment) Required Braces or Orthoses: Spinal Brace Spinal Brace: Thoracolumbosacral orthotic;Applied in sitting position Restrictions Weight Bearing Restrictions: No      Mobility Bed Mobility Overal bed mobility: Modified Independent                  Transfers Overall transfer level: Modified independent                 General transfer comment: increased time      Balance Overall balance assessment: Mild deficits observed, not formally tested                                         ADL either performed or assessed with clinical judgement   ADL Overall ADL's : Modified independent                                        General ADL Comments: Educated on use of 2 cups for brushing teeth, wet wipes for back peri care, sit<>stand stance for lower surfaces to keep back straight, LB dressing techniques, brace donn/doffing, not sitting for more than 20-30 minutes at a time to start wtih and building up to one hour.     Vision Patient Visual Report: No change from baseline              Pertinent Vitals/Pain Pain Assessment Pain Assessment: Faces Faces Pain Scale: Hurts a little bit Pain Location: incisonal Pain Descriptors / Indicators: Burning Pain Intervention(s): Limited activity within patient's tolerance, Monitored during session     Hand Dominance Right   Extremity/Trunk Assessment Upper Extremity Assessment Upper Extremity Assessment: Overall WFL for tasks assessed           Communication Communication Communication: No difficulties   Cognition Arousal/Alertness: Awake/alert Behavior During Therapy: WFL for tasks assessed/performed Overall Cognitive Status: Within Functional Limits for tasks assessed                                       General Comments  tenative when up on feet  without brace on, with brace on moves more readily            Home Living Family/patient expects to be discharged to:: Private residence Living Arrangements: Alone Available Help at Discharge: Family;Available 24 hours/day (several days) Type of Home: House Home Access: Stairs to enter [employer] of Steps: 3 Entrance Stairs-Rails: Left;Right Home Layout: One level     Bathroom Shower/Tub: Walk-in shower;Door   Bathroom Toilet: Handicapped height     Home Equipment: Chartered certified accountant   Additional Comments: adustable bed      Prior Functioning/Environment Prior Level of Function : Independent/Modified Independent                        OT Problem List: Decreased range of motion;Impaired balance (sitting and/or  standing);Pain         OT Goals(Current goals can be found in the care plan section) Acute Rehab OT Goals Patient Stated Goal: to go home today OT Goal Formulation: With patient Potential to Achieve Goals: Good         AM-PAC OT "6 Clicks" Daily Activity     Outcome Measure Help from another person eating meals?: None Help from another person taking care of personal grooming?: None Help from another person toileting, which includes using toliet, bedpan, or urinal?: None Help from another person bathing (including washing, rinsing, drying)?: None Help from another person to put on and taking off regular upper body clothing?: None Help from another person to put on and taking off regular lower body clothing?: None 6 Click Score: 24   End of Session Equipment Utilized During Treatment: Back brace  Activity Tolerance: Patient tolerated treatment well Patient left:  (sitting EOB getting ready to work with PT)  OT Visit Diagnosis: Unsteadiness on feet (R26.81);Pain Pain - part of body:  (front incisional--burning)                Time: 1610-9604 OT Time Calculation (min): 47 min Charges:  OT General Charges $OT Visit: 1 Visit OT Evaluation $OT Eval Moderate Complexity: 1 Mod OT Treatments $Self Care/Home Management : 23-37 mins  [place] Circle, OTR/L Acute [employer] Pager [phone] Office [phone]    Almon Register 05/13/2021, 9:18 AM

## 2021-05-13 NOTE — Plan of Care (Signed)

## 2021-05-13 NOTE — Progress Notes (Addendum)
°  Progress Note    05/13/2021 9:51 AM 1 Day Post-Op  Subjective:  ready to go home but having some nausea    Vitals:   05/13/21 0757 05/13/21 0858  BP: 120/83 99/67  Pulse: 77 66  Resp: 16   Temp: 98.8 F (37.1 C)   SpO2: 96% 96%    Physical Exam: Cardiac:  regular Lungs:  non labored Incisions:  bandage in place and clean and dry Extremities:  palpable PT pulses bilaterally Abdomen:  soft, NT  CBC    Component Value Date/Time   WBC 7.4 05/09/2021 1547   RBC 4.35 05/09/2021 1547   HGB 13.5 05/09/2021 1547   HCT 42.2 05/09/2021 1547   PLT 243 05/09/2021 1547   MCV 97.0 05/09/2021 1547   MCH 31.0 05/09/2021 1547   MCHC 32.0 05/09/2021 1547   RDW 14.0 05/09/2021 1547   LYMPHSABS 1.4 03/01/2020 1030   MONOABS 0.8 03/01/2020 1030   EOSABS 0.1 03/01/2020 1030   BASOSABS 0.0 03/01/2020 1030    BMET    Component Value Date/Time   NA 138 05/09/2021 1547   K 3.6 05/09/2021 1547   CL 104 05/09/2021 1547   CO2 28 05/09/2021 1547   GLUCOSE 137 (H) 05/09/2021 1547   BUN 21 05/09/2021 1547   CREATININE 0.71 05/09/2021 1547   CALCIUM 9.1 05/09/2021 1547   GFRNONAA >60 05/09/2021 1547   GFRAA >90 01/13/2014 0618    INR    Component Value Date/Time   INR 0.9 03/01/2020 1030     Intake/Output Summary (Last 24 hours) at 05/13/2021 0951 Last data filed at 05/12/2021 1242 Gross per 24 hour  Intake 500 ml  Output 650 ml  Net -150 ml     Assessment/Plan:  66 y.o. female is s/p:  Anterior spinal exposure L5/S1 disc space  1 Day Post-Op   -pt with palpable PT pulses; abdomen soft -having some nausea but anticipates dc later today -f/u with Vascular surgery as needed    Leontine Locket, PA-C [facility] [phone] 05/13/2021 9:51 AM  VASCULAR STAFF ADDENDUM: I have independently interviewed and examined the patient. I agree with the above.   Yevonne Aline. Stanford Breed, MD [facility] Phone Number:  [phone] 05/13/2021 2:39 PM

## 2021-05-13 NOTE — Progress Notes (Signed)
Patient awaiting transport via wheelchair by volunteer for discharge home; in no acute distress nor complaints of pain nor discomfort; incision on her abdomen and back with gauze dressing and were clean, dry and intact; room was checked and accounted for all her belongings; discharge instructions concerning her medications, incision care, follow up appointment and when to call the doctor as needed were all discussed with patient by RN and she expressed understanding on the instructions given.

## 2021-05-17 MED FILL — Heparin Sodium (Porcine) Inj 1000 Unit/ML: INTRAMUSCULAR | Qty: 30 | Status: AC

## 2021-05-17 MED FILL — Sodium Chloride IV Soln 0.9%: INTRAVENOUS | Qty: 1000 | Status: AC

## 2021-05-18 NOTE — Discharge Summary (Signed)
Patient ID: Kelsey Fitzpatrick MRN: [id number] DOB/AGE: September 30, 1955 66 y.o.  Admit date: 05/12/2021 Discharge date: 05/13/2021  Admission Diagnoses:  Principal Problem:   Radiculopathy   Discharge Diagnoses:  Same  Past Medical History:  Diagnosis Date   Abnormal Pap smear 2006   ASCUS   Anxiety    Arthritis    OA    Back pain    Cancer ([place])    2012 LT ARM MELANOMA    Chronic kidney disease    Blood in urine   Depression    Encounter for insertion of mirena IUD 2008   Fibroids    Osteopenia    PONV (postoperative nausea and vomiting)    Proteinuria    ever since childhood   Shingles    SI (sacroiliac) joint dysfunction    Sleep apnea     Surgeries: Procedure(s): LUMBAR 5 - SACRUM 1 ANTERIOR LUMBAR INTERBODY FUSION WITH POSTERIOR SPINAL FUSION WITH INSTRUMENTATION AND ALLOGRAFT LUMBAR 5 - SACRUM 1 ANTERIOR LUMBAR INTERBODY FUSION WITH POSTERIOR SPINAL FUSION WITH INSTRUMENTATION AND ALLOGRAFT ABDOMINAL EXPOSURE on 05/12/2021   Consultants: Treatment Team:  Cherre Robins, MD  Discharged Condition: Improved  Hospital Course: Kelsey Fitzpatrick is an 66 y.o. female who was admitted 05/12/2021 for operative treatment of Radiculopathy. Patient has severe unremitting pain that affects sleep, daily activities, and work/hobbies. After pre-op clearance the patient was taken to the operating room on 05/12/2021 and underwent  Procedure(s): LUMBAR 5 - SACRUM 1 ANTERIOR LUMBAR INTERBODY FUSION WITH POSTERIOR SPINAL FUSION WITH INSTRUMENTATION AND ALLOGRAFT LUMBAR 5 - SACRUM 1 ANTERIOR LUMBAR INTERBODY FUSION WITH POSTERIOR SPINAL FUSION WITH INSTRUMENTATION AND ALLOGRAFT ABDOMINAL EXPOSURE.    Patient was given perioperative antibiotics:  Anti-infectives (From admission, onward)    Start     Dose/Rate Route Frequency Ordered Stop   05/12/21 1600  doxycycline (MONODOX) capsule 50 mg  Status:  Discontinued        50 mg Oral Daily 05/12/21 1506 05/12/21 1725    05/12/21 1600  ceFAZolin (ANCEF) IVPB 2g/100 mL premix        2 g 200 mL/hr over 30 Minutes Intravenous Every 8 hours 05/12/21 1506 05/12/21 2251   05/12/21 0600  ceFAZolin (ANCEF) IVPB 2g/100 mL premix        2 g 200 mL/hr over 30 Minutes Intravenous On call to O.R. 05/12/21 0555 05/12/21 1115        Patient was given sequential compression devices, early ambulation to prevent DVT.  Patient benefited maximally from hospital stay and there were no complications.    Recent vital signs: BP 99/67    Pulse 66    Temp 98.8 F (37.1 C)    Resp 16    SpO2 96%    Discharge Medications:   Allergies as of 05/13/2021       Reactions   Codeine Nausea Only, Other (See Comments)   Dizziness   Hydrocodone Nausea And Vomiting   Doxycycline Hyclate Rash, Other (See Comments)   Numb in finger and toes   Sertraline Rash, Other (See Comments)        Medication List     TAKE these medications    ALPRAZolam 0.5 MG tablet Commonly known as: XANAX Take 0.25 mg by mouth at bedtime as needed for sleep.   calcium carbonate 1500 (600 Ca) MG Tabs tablet Commonly known as: OSCAL 600 mg of elemental calcium daily with breakfast.   carboxymethylcellulose 0.5 % Soln Commonly known as: REFRESH PLUS Place  1 drop into both eyes daily as needed (lub. eye).   doxycycline 50 MG capsule Commonly known as: MONODOX Take 50 mg by mouth daily.   escitalopram 10 MG tablet Commonly known as: LEXAPRO Take 10 mg by mouth daily.   fluticasone 0.05 % cream Commonly known as: CUTIVATE 1 application daily as needed (itching).   ketotifen 0.025 % ophthalmic solution Commonly known as: ZADITOR Place 1 drop into both eyes 2 (two) times daily as needed (allergies).   M.V.I. ADULT IV Take 1 tablet by mouth daily.   methocarbamol 500 MG tablet Commonly known as: Robaxin Take 1 tablet (500 mg total) by mouth every 6 (six) hours as needed for muscle spasms.   ondansetron 4 MG tablet Commonly known as:  Zofran Take 1 tablet (4 mg total) by mouth every 8 (eight) hours as needed for nausea or vomiting.   oxyCODONE-acetaminophen 5-325 MG tablet Commonly known as: PERCOCET/ROXICET Take 1-2 tablets by mouth every 4 (four) hours as needed for severe pain.   traMADol 50 MG tablet Commonly known as: ULTRAM Take 50 mg by mouth every 6 (six) hours as needed for moderate pain.        Diagnostic Studies: DG Lumbar Spine 2-3 Views  Result Date: 05/12/2021 CLINICAL DATA:  Fluoroscopic assistance was provided for lumbar fusion EXAM: LUMBAR SPINE - 2-3 VIEW COMPARISON:  11/29/2020 FINDINGS: Fluoroscopic images show posterior lumbar fusion at L5-S1 level. Intervertebral disc spacer is noted. Fluoroscopic time was 176 seconds. Radiation dose is 122.85 mGy. IMPRESSION: Fluoroscopic assistance was provided for lumbar fusion at L5-S1 level. Electronically Signed   By: Elmer Picker M.D.   On: 05/12/2021 13:49   DG C-Arm 1-60 Min-No Report  Result Date: 05/12/2021 Fluoroscopy was utilized by the requesting physician.  No radiographic interpretation.   DG C-Arm 1-60 Min-No Report  Result Date: 05/12/2021 Fluoroscopy was utilized by the requesting physician.  No radiographic interpretation.   DG C-Arm 1-60 Min-No Report  Result Date: 05/12/2021 Fluoroscopy was utilized by the requesting physician.  No radiographic interpretation.   DG C-Arm 1-60 Min-No Report  Result Date: 05/12/2021 Fluoroscopy was utilized by the requesting physician.  No radiographic interpretation.   DG C-Arm 1-60 Min-No Report  Result Date: 05/12/2021 Fluoroscopy was utilized by the requesting physician.  No radiographic interpretation.   DG OR LOCAL ABDOMEN  Result Date: 05/12/2021 CLINICAL DATA:  Status post L5-S1 ALIF. Intraoperative radiograph to rule out foreign body. EXAM: OR LOCAL ABDOMEN COMPARISON:  11/29/2020 FINDINGS: SI joint fusion screws noted. L5-S1 interbody spacer. Coiled wiring projects over the right  ischial tuberosity and is almost certainly outside of the patient. No worrisome findings to indicate abnormal retained foreign body. IMPRESSION: No unexpected foreign body. I phoned this result to OR # [phone] at 10:44 a.m. on 05/12/2021. Electronically Signed   By: Van Clines M.D.   On: 05/12/2021 10:46    Disposition: Discharge disposition: 01-Home or Self Care        POD #1 s/p L5/S1 fusion, doing well   - up with PT/OT, encourage ambulation - Percocet for pain, Robaxin for muscle spasms -Scripts for pain sent to pharmacy electronically  -D/C instructions sheet printed and in chart -D/C today  -F/U in office 2 weeks   Signed: Lennie Muckle Alora Gorey 05/18/2021, 1:13 PM

## 2021-05-26 ENCOUNTER — Other Ambulatory Visit: Payer: Self-pay | Admitting: Orthopedic Surgery

## 2021-05-26 DIAGNOSIS — M5416 Radiculopathy, lumbar region: Secondary | ICD-10-CM

## 2021-05-27 ENCOUNTER — Emergency Department (HOSPITAL_COMMUNITY): Payer: BC Managed Care – PPO

## 2021-05-27 ENCOUNTER — Other Ambulatory Visit: Payer: BC Managed Care – PPO

## 2021-05-27 ENCOUNTER — Inpatient Hospital Stay (HOSPITAL_COMMUNITY)
Admission: EM | Admit: 2021-05-27 | Discharge: 2021-05-29 | DRG: 948 | Disposition: A | Discharge status: Home / Self Care | Payer: BC Managed Care – PPO | Attending: Orthopedic Surgery | Admitting: Orthopedic Surgery

## 2021-05-27 DIAGNOSIS — Z8261 Family history of arthritis: Secondary | ICD-10-CM

## 2021-05-27 DIAGNOSIS — Z85820 Personal history of malignant melanoma of skin: Secondary | ICD-10-CM | POA: Diagnosis not present

## 2021-05-27 DIAGNOSIS — Z79899 Other long term (current) drug therapy: Secondary | ICD-10-CM | POA: Diagnosis not present

## 2021-05-27 DIAGNOSIS — M130 Polyarthritis, unspecified: Secondary | ICD-10-CM | POA: Diagnosis present

## 2021-05-27 DIAGNOSIS — Z885 Allergy status to narcotic agent status: Secondary | ICD-10-CM

## 2021-05-27 DIAGNOSIS — F32A Depression, unspecified: Secondary | ICD-10-CM | POA: Diagnosis present

## 2021-05-27 DIAGNOSIS — M25552 Pain in left hip: Secondary | ICD-10-CM | POA: Diagnosis present

## 2021-05-27 DIAGNOSIS — Z20822 Contact with and (suspected) exposure to covid-19: Secondary | ICD-10-CM | POA: Diagnosis present

## 2021-05-27 DIAGNOSIS — Z823 Family history of stroke: Secondary | ICD-10-CM

## 2021-05-27 DIAGNOSIS — F419 Anxiety disorder, unspecified: Secondary | ICD-10-CM | POA: Diagnosis present

## 2021-05-27 DIAGNOSIS — Z841 Family history of disorders of kidney and ureter: Secondary | ICD-10-CM | POA: Diagnosis not present

## 2021-05-27 DIAGNOSIS — M79672 Pain in left foot: Secondary | ICD-10-CM | POA: Diagnosis present

## 2021-05-27 DIAGNOSIS — N189 Chronic kidney disease, unspecified: Secondary | ICD-10-CM | POA: Diagnosis present

## 2021-05-27 DIAGNOSIS — M79652 Pain in left thigh: Secondary | ICD-10-CM | POA: Diagnosis present

## 2021-05-27 DIAGNOSIS — M545 Low back pain, unspecified: Secondary | ICD-10-CM | POA: Diagnosis not present

## 2021-05-27 DIAGNOSIS — G8918 Other acute postprocedural pain: Secondary | ICD-10-CM | POA: Diagnosis not present

## 2021-05-27 DIAGNOSIS — Z981 Arthrodesis status: Secondary | ICD-10-CM

## 2021-05-27 DIAGNOSIS — Z888 Allergy status to other drugs, medicaments and biological substances status: Secondary | ICD-10-CM

## 2021-05-27 DIAGNOSIS — M5410 Radiculopathy, site unspecified: Secondary | ICD-10-CM

## 2021-05-27 LAB — CBC WITH DIFFERENTIAL/PLATELET
Abs Immature Granulocytes: 0.1 10*3/uL — ABNORMAL HIGH (ref 0.00–0.07)
Basophils Absolute: 0 10*3/uL (ref 0.0–0.1)
Basophils Relative: 0 %
Eosinophils Absolute: 0.1 10*3/uL (ref 0.0–0.5)
Eosinophils Relative: 1 %
HCT: 44 % (ref 36.0–46.0)
Hemoglobin: 14.2 g/dL (ref 12.0–15.0)
Immature Granulocytes: 1 %
Lymphocytes Relative: 22 %
Lymphs Abs: 2.6 10*3/uL (ref 0.7–4.0)
MCH: 30.2 pg (ref 26.0–34.0)
MCHC: 32.3 g/dL (ref 30.0–36.0)
MCV: 93.6 fL (ref 80.0–100.0)
Monocytes Absolute: 1 10*3/uL (ref 0.1–1.0)
Monocytes Relative: 8 %
Neutro Abs: 8.1 10*3/uL — ABNORMAL HIGH (ref 1.7–7.7)
Neutrophils Relative %: 68 %
Platelets: 402 10*3/uL — ABNORMAL HIGH (ref 150–400)
RBC: 4.7 MIL/uL (ref 3.87–5.11)
RDW: 13.8 % (ref 11.5–15.5)
WBC: 11.8 10*3/uL — ABNORMAL HIGH (ref 4.0–10.5)
nRBC: 0 % (ref 0.0–0.2)

## 2021-05-27 LAB — BASIC METABOLIC PANEL
Anion gap: 11 (ref 5–15)
BUN: 19 mg/dL (ref 8–23)
CO2: 27 mmol/L (ref 22–32)
Calcium: 9.8 mg/dL (ref 8.9–10.3)
Chloride: 98 mmol/L (ref 98–111)
Creatinine, Ser: 0.8 mg/dL (ref 0.44–1.00)
GFR, Estimated: >60 mL/min (ref >60)
Glucose, Bld: 102 mg/dL — ABNORMAL HIGH (ref 70–99)
Potassium: 3.6 mmol/L (ref 3.5–5.1)
Sodium: 136 mmol/L (ref 135–145)

## 2021-05-27 LAB — RESP PANEL BY RT-PCR (FLU A&B, COVID) ARPGX2
Influenza A by PCR: NEGATIVE
Influenza B by PCR: NEGATIVE
SARS Coronavirus 2 by RT PCR: NEGATIVE

## 2021-05-27 MED ORDER — MORPHINE SULFATE (PF) 2 MG/ML IV SOLN: 1.0000 mg | INTRAVENOUS | Status: DC | PRN

## 2021-05-27 MED ORDER — ALUM & MAG HYDROXIDE-SIMETH 200-200-20 MG/5ML PO SUSP
30.0000 mL | Freq: Four times a day (QID) | ORAL | Status: DC | PRN
  Administered 2021-05-28: 30 mL via ORAL
  Filled 2021-05-27: qty 30

## 2021-05-27 MED ORDER — DIAZEPAM 2 MG PO TABS
2.0000 mg | ORAL_TABLET | Freq: Four times a day (QID) | ORAL | Status: DC | PRN
  Administered 2021-05-27: 5 mg via ORAL
  Filled 2021-05-27: qty 3

## 2021-05-27 MED ORDER — PHENOL 1.4 % MT LIQD: 1.0000 | OROMUCOSAL | Status: DC | PRN

## 2021-05-27 MED ORDER — POLYVINYL ALCOHOL 1.4 % OP SOLN: 1.0000 [drp] | OPHTHALMIC | Status: DC | PRN

## 2021-05-27 MED ORDER — FLUTICASONE PROPIONATE 0.05 % EX CREA: 1.0000 "application " | TOPICAL_CREAM | Freq: Every day | CUTANEOUS | Status: DC | PRN

## 2021-05-27 MED ORDER — ESCITALOPRAM OXALATE 10 MG PO TABS
10.0000 mg | ORAL_TABLET | Freq: Every day | ORAL | Status: DC
  Administered 2021-05-27 – 2021-05-28 (×2): 10 mg via ORAL
  Filled 2021-05-27 (×3): qty 1

## 2021-05-27 MED ORDER — ONDANSETRON HCL 4 MG PO TABS: 4.0000 mg | ORAL_TABLET | Freq: Three times a day (TID) | ORAL | Status: DC | PRN

## 2021-05-27 MED ORDER — HYDROMORPHONE HCL 2 MG PO TABS
1.0000 mg | ORAL_TABLET | Freq: Four times a day (QID) | ORAL | Status: DC | PRN
  Administered 2021-05-28 – 2021-05-29 (×4): 2 mg via ORAL
  Filled 2021-05-27 (×4): qty 1

## 2021-05-27 MED ORDER — SODIUM CHLORIDE 0.9 % IV SOLN: 250.0000 mL | INTRAVENOUS | Status: DC

## 2021-05-27 MED ORDER — ACETAMINOPHEN 650 MG RE SUPP: 650.0000 mg | RECTAL | Status: DC | PRN

## 2021-05-27 MED ORDER — CALCIUM CARBONATE 1250 (500 CA) MG PO TABS
1.0000 | ORAL_TABLET | Freq: Every day | ORAL | Status: DC
  Administered 2021-05-28 – 2021-05-29 (×2): 500 mg via ORAL
  Filled 2021-05-27 (×2): qty 1

## 2021-05-27 MED ORDER — FLEET ENEMA 7-19 GM/118ML RE ENEM: 1.0000 | ENEMA | Freq: Once | RECTAL | Status: DC | PRN

## 2021-05-27 MED ORDER — ONDANSETRON HCL 4 MG PO TABS: 4.0000 mg | ORAL_TABLET | Freq: Four times a day (QID) | ORAL | Status: DC | PRN

## 2021-05-27 MED ORDER — SODIUM CHLORIDE 0.9% FLUSH: 3.0000 mL | INTRAVENOUS | Status: DC | PRN

## 2021-05-27 MED ORDER — ACETAMINOPHEN 325 MG PO TABS: 650.0000 mg | ORAL_TABLET | ORAL | Status: DC | PRN

## 2021-05-27 MED ORDER — BISACODYL 5 MG PO TBEC: 5.0000 mg | DELAYED_RELEASE_TABLET | Freq: Every day | ORAL | Status: DC | PRN

## 2021-05-27 MED ORDER — ZOLPIDEM TARTRATE 5 MG PO TABS
5.0000 mg | ORAL_TABLET | Freq: Every evening | ORAL | Status: DC | PRN
  Filled 2021-05-27: qty 1

## 2021-05-27 MED ORDER — ONDANSETRON HCL 4 MG/2ML IJ SOLN
4.0000 mg | Freq: Four times a day (QID) | INTRAMUSCULAR | Status: DC | PRN
  Administered 2021-05-28 (×3): 4 mg via INTRAVENOUS
  Filled 2021-05-27 (×3): qty 2

## 2021-05-27 MED ORDER — DOCUSATE SODIUM 100 MG PO CAPS
100.0000 mg | ORAL_CAPSULE | Freq: Two times a day (BID) | ORAL | Status: DC
  Administered 2021-05-27 – 2021-05-29 (×4): 100 mg via ORAL
  Filled 2021-05-27 (×4): qty 1

## 2021-05-27 MED ORDER — CALCIUM CARBONATE 1500 (600 CA) MG PO TABS: 600.0000 mg | ORAL_TABLET | Freq: Every day | ORAL | Status: DC

## 2021-05-27 MED ORDER — ALPRAZOLAM 0.25 MG PO TABS
0.2500 mg | ORAL_TABLET | Freq: Every evening | ORAL | Status: DC | PRN
  Administered 2021-05-28 (×2): 0.25 mg via ORAL
  Filled 2021-05-27 (×2): qty 1

## 2021-05-27 MED ORDER — GABAPENTIN 300 MG PO CAPS
300.0000 mg | ORAL_CAPSULE | Freq: Three times a day (TID) | ORAL | Status: DC
  Administered 2021-05-27 – 2021-05-29 (×6): 300 mg via ORAL
  Filled 2021-05-27 (×6): qty 1

## 2021-05-27 MED ORDER — MENTHOL 3 MG MT LOZG: 1.0000 | LOZENGE | OROMUCOSAL | Status: DC | PRN

## 2021-05-27 MED ORDER — CARBOXYMETHYLCELLULOSE SODIUM 0.5 % OP SOLN: 1.0000 [drp] | Freq: Every day | OPHTHALMIC | Status: DC | PRN

## 2021-05-27 MED ORDER — SENNOSIDES-DOCUSATE SODIUM 8.6-50 MG PO TABS: 1.0000 | ORAL_TABLET | Freq: Every evening | ORAL | Status: DC | PRN

## 2021-05-27 MED ORDER — HYDROMORPHONE HCL 1 MG/ML IJ SOLN
1.0000 mg | INTRAMUSCULAR | Status: DC | PRN
  Administered 2021-05-27 – 2021-05-29 (×5): 1 mg via INTRAVENOUS
  Filled 2021-05-27 (×5): qty 1

## 2021-05-27 MED ORDER — OXYCODONE HCL ER 10 MG PO T12A
10.0000 mg | EXTENDED_RELEASE_TABLET | Freq: Two times a day (BID) | ORAL | Status: DC
  Administered 2021-05-27: 10 mg via ORAL
  Filled 2021-05-27: qty 1

## 2021-05-27 MED ORDER — KETOTIFEN FUMARATE 0.025 % OP SOLN: 1.0000 [drp] | Freq: Two times a day (BID) | OPHTHALMIC | Status: DC | PRN

## 2021-05-27 MED ORDER — DOXYCYCLINE MONOHYDRATE 50 MG PO CAPS: 50.0000 mg | ORAL_CAPSULE | Freq: Every day | ORAL | Status: DC

## 2021-05-27 MED ORDER — SODIUM CHLORIDE 0.9% FLUSH
3.0000 mL | Freq: Two times a day (BID) | INTRAVENOUS | Status: DC
Start: 2021-05-27 — End: 2021-05-29
  Administered 2021-05-27 – 2021-05-29 (×4): 3 mL via INTRAVENOUS

## 2021-05-27 MED ORDER — HYDROMORPHONE HCL 1 MG/ML IJ SOLN
1.0000 mg | Freq: Once | INTRAMUSCULAR | Status: DC
Start: 2021-05-27 — End: 2021-05-29
  Filled 2021-05-27: qty 1

## 2021-05-27 NOTE — ED Notes (Signed)
No response x3 

## 2021-05-27 NOTE — ED Provider Triage Note (Signed)
Emergency Medicine Provider Triage Evaluation Note  Kelsey Fitzpatrick , a 66 y.o. female  was evaluated in triage.  Pt complains of continued back pain after lumbar fusion surgery at the end of January 2023.  States that her pain has not been able to be controlled with home meds.  She has had prior back surgeries in the past but has not experienced this type of pain before.  She has been speaking with her surgeon, Dr. Lynann Bologna and has tried pain medicine as well as steroid Dosepak without improvement.  She was told to come to the ER for imaging and admission by him for pain control.  She continues to experience paresthesias in her legs but denies any changes to gait, weakness, fever.  Review of Systems  Positive: Back pain Negative: Numbness, weakness  Physical Exam  BP (!) 165/115 (BP Location: Left Arm)    Pulse 93    Temp 98.1 F (36.7 C) (Oral)    Resp 18    SpO2 100%  Gen:   Awake, no distress   Resp:  Normal effort  MSK:   Moves extremities without difficulty  Other:  Brace in place  Medical Decision Making  Medically screening exam initiated at 10:04 AM.  Appropriate orders placed.  Barrie Lyme was informed that the remainder of the evaluation will be completed by another provider, this initial triage assessment does not replace that evaluation, and the importance of remaining in the ED until their evaluation is complete.  I have ordered CT and MRI of the lumbar spine as requested by Dr. Lynann Bologna.  We will also obtain basic labs and COVID test.  Advised charge nurse that patient will need room soon as she is being admitted.   Delia Heady, PA-C 05/27/21 1005

## 2021-05-27 NOTE — Progress Notes (Signed)
I did again speak with Kelsey Fitzpatrick.  She is currently in the waiting room.  Her CAT scan has been done and I did review it.  I did telephone her to let her know her CAT scan looks excellent, with no malpositioning of the screws, and no apparent stenosis, although an MRI is still pending.  As she understands, the plan is to admit her for pain control following her MRI, and after her evaluation with the emergency room physician.  I will continue to keep her informed, and I will reach out to her again after her MRI has been obtained.

## 2021-05-27 NOTE — ED Triage Notes (Signed)
Pt. Stated, Im here for my Dr. To admit me for pain control to my back. DR. Lynann Bologna

## 2021-05-27 NOTE — ED Notes (Signed)
Pt crying in pain requesting something additional, pt offered valium for spasms. Pt alert and oriented

## 2021-05-27 NOTE — ED Notes (Signed)
Called PT for vitals at 13:46 no answer.

## 2021-05-27 NOTE — ED Notes (Signed)
ED Provider at bedside. 

## 2021-05-27 NOTE — ED Notes (Addendum)
Called PT at 14:24  for vitals no answer

## 2021-05-27 NOTE — ED Provider Notes (Signed)
[facility] EMERGENCY DEPARTMENT Provider Note   CSN: [id number] Arrival date & time: 05/27/21  0930     History  Chief Complaint  Patient presents with   Back Pain    Kelsey Fitzpatrick is a 67 y.o. female.  The history is provided by the patient and medical records. No language interpreter was used.  Back Pain  66 year old female significant history of recent anterior lumbar interbody fusion with posterior spinal fusion with instrumentation and allograft that was done on 05/12/2021 by Dr. Lynann Bologna who presents today for evaluation of left thigh pain and for better pain management.  Patient report for more than a week she has had pain to her left low back radiates towards her left hip and thigh.  Pain is a constant sensation sharp stabbing achy nothing seems to make it better or worse.  Despite taking medication prescribed which include steroids, and Dilaudid, her pain persist.  She does not complain of any fever chills no abdominal pain no nausea vomiting diarrhea dysuria bowel or bladder incontinence or saddle anesthesia.  She was seen by orthopedist Dr. Today, had a CT scan performed and was told that was normal.  An MRI was ordered for further assessment and patient was told that she could be admitted to the hospital for better pain control.  Patient states she has been pacing around the house due to her ongoing pain and she is frustrated.  Home Medications Prior to Admission medications   Medication Sig Start Date End Date Taking? Authorizing Provider  ALPRAZolam Duanne Moron) 0.5 MG tablet Take 0.25 mg by mouth at bedtime as needed for sleep.     [provider]  calcium carbonate (OSCAL) 1500 (600 Ca) MG TABS tablet 600 mg of elemental calcium daily with breakfast.    [provider]  carboxymethylcellulose (REFRESH PLUS) 0.5 % SOLN Place 1 drop into both eyes daily as needed (lub. eye).    [provider]  doxycycline (MONODOX) 50 MG capsule  Take 50 mg by mouth daily.    [provider]  escitalopram (LEXAPRO) 10 MG tablet Take 10 mg by mouth daily.    [provider]  fluticasone (CUTIVATE) 0.34 % cream 1 application daily as needed (itching).    [provider]  ketotifen (ZADITOR) 0.025 % ophthalmic solution Place 1 drop into both eyes 2 (two) times daily as needed (allergies).    [provider]  methocarbamol (ROBAXIN) 500 MG tablet Take 1 tablet (500 mg total) by mouth every 6 (six) hours as needed for muscle spasms. 03/03/20   McKenzie, Lennie Muckle, PA-C  Multiple Vitamin (M.V.I. ADULT IV) Take 1 tablet by mouth daily.    [provider]  ondansetron (ZOFRAN) 4 MG tablet Take 1 tablet (4 mg total) by mouth every 8 (eight) hours as needed for nausea or vomiting. 03/03/20   McKenzie, Lennie Muckle, PA-C  oxyCODONE-acetaminophen (PERCOCET/ROXICET) 5-325 MG tablet Take 1-2 tablets by mouth every 4 (four) hours as needed for severe pain. 05/13/21   Phylliss Bob, MD  traMADol (ULTRAM) 50 MG tablet Take 50 mg by mouth every 6 (six) hours as needed for moderate pain.    [provider]      Allergies    Codeine, Hydrocodone, Doxycycline hyclate, and Sertraline    Review of Systems   Review of Systems  Musculoskeletal:  Positive for back pain.  All other systems reviewed and are negative.  Physical Exam Updated Vital Signs BP (!) 140/110 (BP Location:  Left Arm)    Pulse (!) 108    Temp 98.5 F (36.9 C) (Oral)    Resp 14    SpO2 94%  Physical Exam Vitals and nursing note reviewed.  Constitutional:      General: She is not in acute distress.    Appearance: She is well-developed.  HENT:     Head: Atraumatic.  Eyes:     Conjunctiva/sclera: Conjunctivae normal.  Cardiovascular:     Rate and Rhythm: Normal rate and regular rhythm.     Pulses: Normal pulses.     Heart sounds: Normal heart sounds.  Pulmonary:     Effort: Pulmonary effort is normal.  Abdominal:     Palpations:  Abdomen is soft.  Musculoskeletal:     Cervical back: Neck supple.     Comments: Patient is wearing a TLSO.  Skin:    Findings: No rash.     Comments: Well-healing surgical wound.   Tenderness along left hip and thigh on palpation with normal range of motion about bilateral hips and 5 of 5 strength to bilateral lower extremities with intact pedal pulses.  Normal skin tone.  Neurological:     Mental Status: She is alert.  Psychiatric:        Mood and Affect: Mood normal.    ED Results / Procedures / Treatments   Labs (all labs ordered are listed, but only abnormal results are displayed) Labs Reviewed  BASIC METABOLIC PANEL - Abnormal; Notable for the following components:      Result Value   Glucose, Bld 102 (*)    All other components within normal limits  CBC WITH DIFFERENTIAL/PLATELET - Abnormal; Notable for the following components:   WBC 11.8 (*)    Platelets 402 (*)    Neutro Abs 8.1 (*)    Abs Immature Granulocytes 0.10 (*)    All other components within normal limits  RESP PANEL BY RT-PCR (FLU A&B, COVID) [id number]    EKG None  Radiology CT Lumbar Spine Wo Contrast  Result Date: 05/27/2021 CLINICAL DATA:  Low back pain, prior surgery, new symptoms EXAM: CT LUMBAR SPINE WITHOUT CONTRAST TECHNIQUE: Multidetector CT imaging of the lumbar spine was performed without intravenous contrast administration. Multiplanar CT image reconstructions were also generated. RADIATION DOSE REDUCTION: This exam was performed according to the departmental dose-optimization program which includes automated exposure control, adjustment of the mA and/or kV according to patient size and/or use of iterative reconstruction technique. COMPARISON:  Preoperative MRI FINDINGS: Segmentation: 5 lumbar type vertebrae. Alignment: Grade 1 anterolisthesis L5-S1 similar to prior MRI. Vertebrae: Postoperative changes at L5-S1 with rods and pedicle screws and interbody spacer. There is likely some interbody  bridging bone centrally. There is associated streak artifact. No evidence of hardware complication. Vertebral body heights are maintained. Paraspinal and other soft tissues: Unremarkable. Disc levels: Minor disc bulges and mild facet arthropathy are present above the operative level without significant canal or foraminal narrowing. The canal and foramina are not well evaluated at the operative level. Endplate osteophytes are present and there is facet hypertrophy. However, there is no apparent significant stenosis. IMPRESSION: Postoperative changes at L5-S1 without evidence of hardware complication. No apparent new or progressive stenosis. Electronically Signed   By: Macy Mis M.D.   On: 05/27/2021 10:44   MR LUMBAR SPINE WO CONTRAST  Result Date: 05/27/2021 CLINICAL DATA:  Low back pain.  L5-S1 ALIF on 05/12/2021. EXAM: MRI LUMBAR SPINE WITHOUT CONTRAST TECHNIQUE: Multiplanar, multisequence MR imaging of the lumbar spine was  performed. No intravenous contrast was administered. COMPARISON:  12/01/2020 and CT scan from 05/27/2021 FINDINGS: Segmentation: The lowest lumbar type non-rib-bearing vertebra is labeled as L5. Alignment: Minimal grade 1 anterolisthesis at L5-S1. Mildly exaggerated lower lumbar lordosis. Vertebrae: Substantial bilateral facet joint effusions at L5-S1. Bilateral Tarlov cysts at the S2-3 level. Intervertebral spacer device at L5-S1. No observed fracture. Conus medullaris and cauda equina: Conus extends to the mid L2 level, borderline low. Conus and cauda equina appear otherwise normal. No distal tethering mass. Paraspinal and other soft tissues: Right greater than left paraspinal edema the L4-5 level, not unexpected in this postoperative setting. Left SI joint fixation screws noted. Bilateral renal fluid signal intensity lesions favor cysts. Disc levels: L1-2: No impingement.  Shallow right paracentral disc protrusion. L2-3: No impingement. Mild disc bulge and right paracentral annular  tear. L3-4: No impingement.  Diffuse disc bulge. L4-5: No impingement. Mild disc bulge with biforaminal annular tears. L5-S1: Borderline left foraminal stenosis due to spurring. IMPRESSION: 1. No overt lumbar impingement. Multilevel degenerative disc disease. 2. Substantial bilateral facet joint effusions at L5-S1. 3. Small bilateral Tarlov cysts at S2-3. 4. Recent ALIF at L5-S1. 5. Borderline low conus medullaris at the mid L2 level, without a visible tethering mass. Electronically Signed   By: Van Clines M.D.   On: 05/27/2021 15:07    Procedures Procedures    Medications Ordered in ED Medications - No data to display  ED Course/ Medical Decision Making/ A&P                           Medical Decision Making  BP (!) 140/110 (BP Location: Left Arm)    Pulse (!) 108    Temp 98.5 F (36.9 C) (Oral)    Resp 14    SpO2 94%   3:51 PM This is a 66 year old female who has had severe pain to her low back and bilateral legs previously had anterior lumbar into fusion of L5-S1 performed by Dr. Lynann Bologna on 05/12/2021.  She is here due to having persistent pain to her left hip and thigh for more than a week that failed conservative management which includes steroids, and Dilaudid pain medication.  She was reevaluated by Dr. Lynann Bologna today for her complaint and had a CT scan of her lumbar spine as well as MRI of the lumbar spine for further assessment of her condition.  I have independently visualized and independently interpreted both imaging which shows expected postoperative change at the level of L5-S1 without signs of complication.  MRI did show substantial bilateral facet joint effusion at L5-S1.  I suspect this is an expected finding given patient recent procedure.  No overt lumbar impingement were noted.  However due to patient's persistent pain, will reach out to orthopedic to have patient admitted for further pain management.  4:13 PM Appreciate consultation from orthopedist Dr. Lynann Bologna who  agrees with admission for pain control  This patient presents to the ED for concern of back pain, this involves an extensive number of treatment options, and is a complaint that carries with it a high risk of complications and morbidity.  The differential diagnosis includes epidural abscess, discitis, hardware complication, postoperative infection, caudal equina, sciatica, cellulitis  Co morbidities that complicate the patient evaluation polyarthritis  Melanoma  osteopenia Additional history obtained:  Additional history obtained from family at bedside External records from outside source obtained and reviewed including notes from pt's orthopedist Dr. Damien Fusi  Lab Tests:  I  Ordered, and personally interpreted labs.  The pertinent results include:  mild leukocytosis with WBC 11.8  Imaging Studies ordered:  I ordered imaging studies including Lspine CT, and Lspine MRI I independently visualized and interpreted imaging which showed postoperative changes I agree with the radiologist interpretation  Cardiac Monitoring:  The patient was maintained on a cardiac monitor.  I personally viewed and interpreted the cardiac monitored which showed an underlying rhythm of: sinus tachycardia  Medicines ordered and prescription drug management:  I ordered medication including dilaudid  for pain control Reevaluation of the patient after these medicines showed that the patient improved I have reviewed the patients home medicines and have made adjustments as needed  Test Considered: as above    Critical Interventions: as above  Consultations Obtained:  I requested consultation with the orthopedist DR. Dumonski,  and discussed lab and imaging findings as well as pertinent plan - they recommend: admission for pain control  Problem List / ED Course: radicular left leg pain  Reevaluation:  After the interventions noted above, I reevaluated the patient and found that they have  :improved  Social Determinants of Health: age  Access to medicine and outpt care  Dispostion:  After consideration of the diagnostic results and the patients response to treatment, I feel that the patent would benefit from admission for pain control.         Final Clinical Impression(s) / ED Diagnoses Final diagnoses:  Radicular low back pain    Rx / DC Orders ED Discharge Orders     None         Domenic Moras, PA-C 05/27/21 1614    Lajean Saver, MD 05/27/21 2215

## 2021-05-27 NOTE — H&P (Signed)
PREOPERATIVE H&P  Chief Complaint: Left thigh pain  HPI: Kelsey Fitzpatrick is a 66 y.o. female who I spoke with earlier this morning, with increasing, no severe pain in the left thigh.  I did previously recommend imaging studies, but she has stated that her pain has become to significant to wait for the imaging studies to be performed.  Percocet and Dilaudid have not been helpful to her.  I did recommend that she present to the emergency department for additional workup and likely, for admission for pain control.  She states the left thigh pain is constant, and does involve the anterior, lateral, and posterior thigh.  She does have some pain in the left heel, which she states is similar to the pain she had prior to surgery, but her left thigh pain is her primary limitation.  It is described as a stabbing sensation.   Patient has failed multiple forms of conservative care and continues to have pain (see office notes for additional details regarding the patient's full course of treatment)  Past Medical History:  Diagnosis Date   Abnormal Pap smear 2006   ASCUS   Anxiety    Arthritis    OA    Back pain    Cancer ([place])    2012 LT ARM MELANOMA    Chronic kidney disease    Blood in urine   Depression    Encounter for insertion of mirena IUD 2008   Fibroids    Osteopenia    PONV (postoperative nausea and vomiting)    Proteinuria    ever since childhood   Shingles    SI (sacroiliac) joint dysfunction    Sleep apnea    Past Surgical History:  Procedure Laterality Date   ABDOMINAL EXPOSURE N/A 05/12/2021   Procedure: ABDOMINAL EXPOSURE;  Surgeon: Cherre Robins, MD;  Location: [place];  Service: Vascular;  Laterality: N/A;   ANTERIOR LUMBAR FUSION N/A 05/12/2021   Procedure: LUMBAR 5 - SACRUM 1 ANTERIOR LUMBAR INTERBODY FUSION WITH POSTERIOR SPINAL FUSION WITH INSTRUMENTATION AND ALLOGRAFT;  Surgeon: Phylliss Bob, MD;  Location: [place];  Service: Orthopedics;  Laterality: N/A;    BUNIONECTOMY     BILAT 1995    COLONOSCOPY     MULTIPLE TOOTH EXTRACTIONS     SACROILIAC JOINT FUSION  02/14/2012   Procedure: SACROILIAC JOINT FUSION;  Surgeon: Sinclair Ship, MD;  Location: [place];  Service: Orthopedics;  Laterality: Left;  Left sacroiliac joint fusion   SACROILIAC JOINT FUSION Left 03/03/2020   Procedure: REVISION LEFT SACROILIAC JOINT FUSION;  Surgeon: Phylliss Bob, MD;  Location: [place];  Service: Orthopedics;  Laterality: Left;   WISDOM TOOTH EXTRACTION     Social History   Socioeconomic History   Marital status: Single    Spouse name: Not on file   Number of children: Not on file   Years of education: Not on file   Highest education level: Not on file  Occupational History   Not on file  Tobacco Use   Smoking status: Never   Smokeless tobacco: Never  Vaping Use   Vaping Use: Never used  Substance and Sexual Activity   Alcohol use: Yes    Comment: occasionally maybe 2x a week   Drug use: No   Sexual activity: Yes    Birth control/protection: None  Other Topics Concern   Not on file  Social History Narrative   Not on file   Social Determinants of Health   Financial  Resource Strain: Not on file  Food Insecurity: Not on file  Transportation Needs: Not on file  Physical Activity: Not on file  Stress: Not on file  Social Connections: Not on file   Family History  Problem Relation Age of Onset   Kidney disease Father    Dementia Mother    Stroke Mother    Rheum arthritis Mother    Allergies  Allergen Reactions   Codeine Nausea Only and Other (See Comments)    Dizziness   Hydrocodone Nausea And Vomiting   Doxycycline Hyclate Rash and Other (See Comments)    Numb in finger and toes   Sertraline Rash and Other (See Comments)   Prior to Admission medications   Medication Sig Start Date End Date Taking? Authorizing Provider  ALPRAZolam Duanne Moron) 0.5 MG tablet Take 0.25 mg by mouth at bedtime as needed for sleep.     [provider]  calcium carbonate (OSCAL) 1500 (600 Ca) MG TABS tablet 600 mg of elemental calcium daily with breakfast.    [provider]  carboxymethylcellulose (REFRESH PLUS) 0.5 % SOLN Place 1 drop into both eyes daily as needed (lub. eye).    [provider]  doxycycline (MONODOX) 50 MG capsule Take 50 mg by mouth daily.    [provider]  escitalopram (LEXAPRO) 10 MG tablet Take 10 mg by mouth daily.    [provider]  fluticasone (CUTIVATE) 8.12 % cream 1 application daily as needed (itching).    [provider]  ketotifen (ZADITOR) 0.025 % ophthalmic solution Place 1 drop into both eyes 2 (two) times daily as needed (allergies).    [provider]  methocarbamol (ROBAXIN) 500 MG tablet Take 1 tablet (500 mg total) by mouth every 6 (six) hours as needed for muscle spasms. 03/03/20   McKenzie, Lennie Muckle, PA-C  Multiple Vitamin (M.V.I. ADULT IV) Take 1 tablet by mouth daily.    [provider]  ondansetron (ZOFRAN) 4 MG tablet Take 1 tablet (4 mg total) by mouth every 8 (eight) hours as needed for nausea or vomiting. 03/03/20   McKenzie, Lennie Muckle, PA-C  oxyCODONE-acetaminophen (PERCOCET/ROXICET) 5-325 MG tablet Take 1-2 tablets by mouth every 4 (four) hours as needed for severe pain. 05/13/21   Phylliss Bob, MD  traMADol (ULTRAM) 50 MG tablet Take 50 mg by mouth every 6 (six) hours as needed for moderate pain.    [provider]     All other systems have been reviewed and were otherwise negative with the exception of those mentioned in the HPI and as above.  Physical Exam: Vitals:   05/27/21 0935 05/27/21 1124  BP: (!) 165/115 (!) 143/99  Pulse: 93 100  Resp: 18 14  Temp: 98.1 F (36.7 C) 98.3 F (36.8 C)  SpO2: 100% 97%    There is no height or weight on file to calculate BMI.  Patient appears comfortable.  She does have tenderness over the region of the lateral hip and greater trochanter.  Her posterior  incisions are well-healed.   MUSCULOSKELETAL: Patient is noted to have full strength in her bilateral lower extremities.  I did review the patient's CAT scan from earlier today, as documented.  It is entirely normal, with no malpositioning of her hardware.  Her fusion construct looks perfect.  Assessment/Plan:  Zya presents with increasing pain in the left thigh.  Her pain is nondermatomal, including the anterior, lateral, and posterior thigh.  She is currently pending an MRI.  She will  at least need to be admitted for pain control, and she states that her pain is adequately controlled with oral pain medication.if her lumbar MRI is unremarkable, we may need to consider an MRI of her left thigh, or some other muscular skeletal pathology.  I will keep an eye out for her MRI, and she will be admitted to the floor when a bed becomes available.   Norva Karvonen, MD 05/27/2021 12:59 PM

## 2021-05-28 ENCOUNTER — Inpatient Hospital Stay: Admission: RE | Admit: 2021-05-28 | Payer: BC Managed Care – PPO | Source: Ambulatory Visit

## 2021-05-28 ENCOUNTER — Other Ambulatory Visit: Payer: Self-pay

## 2021-05-28 ENCOUNTER — Encounter (HOSPITAL_COMMUNITY): Payer: Self-pay | Admitting: Orthopedic Surgery

## 2021-05-28 MED ORDER — FAMOTIDINE 20 MG PO TABS
20.0000 mg | ORAL_TABLET | Freq: Every day | ORAL | Status: DC
  Administered 2021-05-28 – 2021-05-29 (×2): 20 mg via ORAL
  Filled 2021-05-28 (×2): qty 1

## 2021-05-28 MED ORDER — DIAZEPAM 5 MG PO TABS
2.5000 mg | ORAL_TABLET | Freq: Four times a day (QID) | ORAL | Status: DC | PRN
  Filled 2021-05-28: qty 1

## 2021-05-28 NOTE — Progress Notes (Signed)
Physical Therapy Discharge Patient Details Name: Kelsey Fitzpatrick MRN: [id number] DOB: [birth year] Today's Date: 05/28/2021 Time: 4436-0165 PT Time Calculation (min) (ACUTE ONLY): 24 min  Patient discharged from PT services secondary to pt moving with modified independence.  No skilled PT required in acute care.  Please see latest therapy progress note for current level of functioning and progress toward goals.    Progress and discharge plan discussed with patient and/or caregiver: Patient/Caregiver agrees with plan  GP   Cordel Drewes A. Saragrace Selke, PT, DPT Acute Rehabilitation Services Office: [place] 05/28/2021, 2:00 PM

## 2021-05-28 NOTE — Evaluation (Signed)
Physical Therapy Evaluation Patient Details Name: Kelsey Fitzpatrick MRN: [id number] DOB: February 02, 1956 Today's Date: 05/28/2021  History of Present Illness  Pt. is 66 yr old F admitted on 05/27/21 with intractable L thigh pain following recent lALIF with posterior fusion on 1/26.  PMH: anxiety, arthritis, CKD, depression, osteopenia, SI joint dysfunction  Clinical Impression  Pt had recent ALIF surgery ~2 weeks ago and was back to managing independently with use of TLSO.  Pt now admitted with c/o inc L thigh pain resulting in need to use RW, but pt remains mod I with use of AD for functional mobility.  Pt is not appropriate for skilled PT in acute care but will be referred to mobility specialists to encourage ongoing functional mobility during admission.  Pt is safe to return home on D/C and should continue therapy as recommended by her surgeon.     Recommendations for follow up therapy are one component of a multi-disciplinary discharge planning process, led by the attending physician.  Recommendations may be updated based on patient status, additional functional criteria and insurance authorization.  Follow Up Recommendations Follow physician's recommendations for discharge plan and follow up therapies    Assistance Recommended at Discharge PRN  Patient can return home with the following  Assistance with cooking/housework;Assist for transportation    Equipment Recommendations  (Pt. owns necessary equipment)  Recommendations for Other Services       Functional Status Assessment Patient has had a recent decline in their functional status and demonstrates the ability to make significant improvements in function in a reasonable and predictable amount of time.     Precautions / Restrictions Precautions Precautions: Back Spinal Brace: Thoracolumbosacral orthotic (Okay to rest in bed and amb to bathroom without brace; apply in sitting)      Mobility  Bed Mobility Overal bed mobility:  Modified Independent             General bed mobility comments: Needs VCs not to break lumbar precautions. Patient Response: Cooperative  Transfers Overall transfer level: Modified independent Equipment used: Rolling walker (2 wheels)               General transfer comment: VCs not to pull up on RW, but to push up from bed.  Able to don brace with min A in sitting.  Able to transfer on and off commode with use of grab bar and without difficulty.    Ambulation/Gait Ambulation/Gait assistance: Modified independent (Device/Increase time) Gait Distance (Feet): 250 Feet Assistive device: Rolling walker (2 wheels) Gait Pattern/deviations: WFL(Within Functional Limits)       General Gait Details: Declines amb outside of room only secondary to nausea.  Able to amb multiple laps in room without difficulty.  Does c/o slight dizziness towards end of amb.  NSG notified of nausea.  Pt. requires intermittent VCs to not twist through spine during activity.  Stairs            Wheelchair Mobility    Modified Rankin (Stroke Patients Only)       Balance Overall balance assessment: Modified Independent                                           Pertinent Vitals/Pain Pain Assessment Pain Assessment: 0-10 Pain Score: 3  Pain Location: L thigh Pain Descriptors / Indicators: Aching, Discomfort Pain Intervention(s): Limited activity within patient's tolerance, Monitored during session  Home Living Family/patient expects to be discharged to:: Private residence Living Arrangements: Alone   Type of Home: House Home Access: Stairs to enter Entrance Stairs-Rails: Can reach both Entrance Stairs-Number of Steps: 2   Home Layout: One level Home Equipment: Conservation officer, nature (2 wheels) Additional Comments: Pt had progressed off of RW after surgery but started using it again she she got to hospital.    Prior Function Prior Level of Function : Independent/Modified  Independent                     Hand Dominance        Extremity/Trunk Assessment   Upper Extremity Assessment Upper Extremity Assessment: Defer to OT evaluation    Lower Extremity Assessment Lower Extremity Assessment: Overall WFL for tasks assessed       Communication   Communication: No difficulties  Cognition Arousal/Alertness: Awake/alert Behavior During Therapy: WFL for tasks assessed/performed Overall Cognitive Status: Within Functional Limits for tasks assessed                                 General Comments: Pt. is sleeping when PT arrives but arouses easily.  Agreeable to work with PT.  Wants to amb to bathroom.        General Comments      Exercises     Assessment/Plan    PT Assessment Patient does not need any further PT services  PT Problem List Decreased knowledge of use of DME;Decreased knowledge of precautions       PT Treatment Interventions DME instruction;Patient/family education    PT Goals (Current goals can be found in the Care Plan section)  Acute Rehab PT Goals Patient Stated Goal: Pt's goal is to understand how to manage L thigh pain. PT Goal Formulation: With patient Time For Goal Achievement: 06/11/21 Potential to Achieve Goals: Good    Frequency       Co-evaluation               AM-PAC PT "6 Clicks" Mobility  Outcome Measure Help needed turning from your back to your side while in a flat bed without using bedrails?: None Help needed moving from lying on your back to sitting on the side of a flat bed without using bedrails?: None Help needed moving to and from a bed to a chair (including a wheelchair)?: None Help needed standing up from a chair using your arms (e.g., wheelchair or bedside chair)?: A Little Help needed to walk in hospital room?: None Help needed climbing 3-5 steps with a railing? : None 6 Click Score: 23    End of Session Equipment Utilized During Treatment: Gait belt Activity  Tolerance: Patient tolerated treatment well Patient left: in bed;with call bell/phone within reach Nurse Communication: Mobility status PT Visit Diagnosis: Pain Pain - Right/Left: Left Pain - part of body: Hip    Time: 4481-8563 PT Time Calculation (min) (ACUTE ONLY): 24 min   Charges:   PT Evaluation $PT Eval Low Complexity: 1 Low PT Treatments $Gait Training: 8-22 mins       Katelynd Blauvelt A. Renell Coaxum, PT, DPT Acute Rehabilitation Services Office: [place] 05/28/2021, 1:56 PM

## 2021-05-28 NOTE — Evaluation (Signed)
Occupational Therapy Evaluation Patient Details Name: Kelsey Fitzpatrick MRN: [id number] DOB: 07/16/1955 Today's Date: 05/28/2021   History of Present Illness Pt. is 66 yr old F admitted on 05/27/21 with intractable L thigh pain following recent lALIF with posterior fusion on 1/26.  PMH: anxiety, arthritis, CKD, depression, osteopenia, SI joint dysfunction   Clinical Impression   Pt lives alone, reports independence at baseline with ADLs and functional mobility. Pt currently at baseline/Mod I with ADLs, bed mobility, and transfers. Uses RW in room for comfort due to nausea/meds, however pt able to ambulate in bathroom and stand statically without RW support. Pt demonstrates compensatory strategies for LB dressing and bed mobility, able to verbalize back precautions and adhere to them throughout session. Pt presenting with impairments listed below, however has no acute OT needs at this time. Will s/o, please reconsult if needed.     Recommendations for follow up therapy are one component of a multi-disciplinary discharge planning process, led by the attending physician.  Recommendations may be updated based on patient status, additional functional criteria and insurance authorization.   Follow Up Recommendations  No OT follow up    Assistance Recommended at Discharge PRN  Patient can return home with the following Assistance with cooking/housework    Functional Status Assessment  Patient has had a recent decline in their functional status and demonstrates the ability to make significant improvements in function in a reasonable and predictable amount of time.  Equipment Recommendations  None recommended by OT;Other (comment) (pt has all needed DME)    Recommendations for Other Services       Precautions / Restrictions Precautions Precautions: Back Precaution Booklet Issued: No Required Braces or Orthoses: Spinal Brace Spinal Brace: Thoracolumbosacral orthotic Restrictions Weight  Bearing Restrictions: No      Mobility Bed Mobility Overal bed mobility: Modified Independent             General bed mobility comments: sidelying on couch upon arrival, demonstrates good use of log rolling technique to sit up    Transfers Overall transfer level: Modified independent Equipment used: Rolling walker (2 wheels)               General transfer comment: feels more comfortable with RW due to nausea/medications at this time, however is able to ambulate without.      Balance Overall balance assessment: No apparent balance deficits (not formally assessed)                                         ADL either performed or assessed with clinical judgement   ADL Overall ADL's : At baseline;Modified independent                                       General ADL Comments: Pt demonstrates compensatoyr strategies for dressing/grooming/toileting tasks. Is able to ambulate in room with/without RW without LOB. feels more comfortable with RW at this time due to medications.     Vision Baseline Vision/History: 1 Wears glasses Vision Assessment?: No apparent visual deficits     Perception     Praxis      Pertinent Vitals/Pain Pain Assessment Pain Assessment: Faces Pain Score: 2  Faces Pain Scale: Hurts a little bit Pain Location: L thigh Pain Descriptors / Indicators: Aching, Discomfort Pain Intervention(s): Monitored during  session, Limited activity within patient's tolerance, Repositioned     Hand Dominance     Extremity/Trunk Assessment Upper Extremity Assessment Upper Extremity Assessment: Overall WFL for tasks assessed   Lower Extremity Assessment Lower Extremity Assessment: Defer to PT evaluation   Cervical / Trunk Assessment Cervical / Trunk Assessment: Back Surgery   Communication Communication Communication: No difficulties   Cognition Arousal/Alertness: Awake/alert Behavior During Therapy: WFL for tasks  assessed/performed Overall Cognitive Status: Within Functional Limits for tasks assessed                                       General Comments  Reviewed back precautions and compensatory strategy education with pt. Pt has no concerns about self-care upon returning home.    Exercises     Shoulder Instructions      Home Living Family/patient expects to be discharged to:: Private residence Living Arrangements: Alone Available Help at Discharge: Family;Available PRN/intermittently Type of Home: House Home Access: Stairs to enter [employer] of Steps: 2 Entrance Stairs-Rails: Can reach both Home Layout: One level     Bathroom Shower/Tub: Walk-in shower;Door   Bathroom Toilet: Handicapped height     Home Equipment: Conservation officer, nature (2 wheels)   Additional Comments: Pt had progressed off of RW after surgery but started using it again she she got to hospital.      Prior Functioning/Environment Prior Level of Function : Independent/Modified Independent                        OT Problem List: Pain;Decreased range of motion      OT Treatment/Interventions:      OT Goals(Current goals can be found in the care plan section) Acute Rehab OT Goals Patient Stated Goal: manage pain OT Goal Formulation: With patient Time For Goal Achievement: 06/11/21 Potential to Achieve Goals: Good  OT Frequency:      Co-evaluation              AM-PAC OT "6 Clicks" Daily Activity     Outcome Measure Help from another person eating meals?: None Help from another person taking care of personal grooming?: None Help from another person toileting, which includes using toliet, bedpan, or urinal?: None Help from another person bathing (including washing, rinsing, drying)?: None Help from another person to put on and taking off regular upper body clothing?: None Help from another person to put on and taking off regular lower body clothing?: None 6 Click  Score: 24   End of Session Equipment Utilized During Treatment: Rolling walker (2 wheels);Back brace Nurse Communication: Mobility status  Activity Tolerance: Patient tolerated treatment well Patient left: in chair;with call bell/phone within reach  OT Visit Diagnosis: Unsteadiness on feet (R26.81);Pain Pain - Right/Left: Left Pain - part of body: Leg                Time: 1440-1456 OT Time Calculation (min): 16 min Charges:  OT General Charges $OT Visit: 1 Visit OT Evaluation $OT Eval Low Complexity: 1 Low  Lynnda Child, OTD, OTR/L Acute Rehab [phone]) [phone]   Kaylyn Lim 05/28/2021, 3:16 PM

## 2021-05-28 NOTE — Progress Notes (Signed)
PATIENT ID: Kelsey Fitzpatrick  MRN: [id number]  DOB/AGE:  10-29-1955 / 66 y.o.   Subjective: Patient sitting in bed, texting on phone upon entry to room. She reports that her pain is "up to an 8" but she was able to get some rest last night. She reports headache, nausea, and vomiting from medications. No c/o chest pain or SOB. Continues to have some left lower lumbar pain as well as left proximal anterolateral thigh pain. She denies numbness and tingling in the legs. Voiding well. Positive flatus.    Objective: Vital signs in last 24 hours: Temp:  [97.6 F (36.4 C)-98.5 F (36.9 C)] 97.7 F (36.5 C) (02/11 0747) Pulse Rate:  [70-108] 78 (02/11 0747) Resp:  [10-20] 17 (02/11 0747) BP: (112-165)/(83-115) 148/83 (02/11 0747) SpO2:  [93 %-100 %] 96 % (02/11 0747)   Recent Labs    05/27/21 1009  HGB 14.2   Recent Labs    05/27/21 1009  WBC 11.8*  RBC 4.70  HCT 44.0  PLT 402*   Recent Labs    05/27/21 1009  NA 136  K 3.6  CL 98  CO2 27  BUN 19  CREATININE 0.80  GLUCOSE 102*  CALCIUM 9.8     Physical Exam: Neurologically intact Neurovascular intact- distal pulses intact and sensation to light touch intact in LE Dorsiflexion/Plantar flexion intact Compartments soft LE No pain with ROM of the knees and hips Mild myofascial tenderness over the left proximal anterior thigh but no mass or hematoma appreciated  Assessment/Plan: Lumbar and left thigh pain, 2 weeks s/p LUMBAR 5 - SACRUM 1 ANTERIOR LUMBAR INTERBODY FUSION WITH POSTERIOR SPINAL FUSION WITH INSTRUMENTATION AND ALLOGRAFT   Advance diet as tolerated Up with therapy Weight Bearing as Tolerated (WBAT)  Continue pain management. Patient appears to have improved slightly. CT and MRI lumbar spine look good. She is having trouble with nausea and vomiting from pain meds. Zofran given by RN. No new studies to be ordered at this time. Plan for patient to work with PT later today as tolerated. Dr. Lynann Bologna will continue  to follow.    Kelsey Shipes L. [place], PA-C 05/28/2021, 8:19 AM

## 2021-05-28 NOTE — Plan of Care (Signed)

## 2021-05-28 NOTE — Progress Notes (Addendum)
°  Transition of Care [facility]) Screening Note   Patient Details  Name: Kelsey Fitzpatrick Date of Birth: 1956/02/14   Transition of Care [facility]) CM/SW Contact:    Bartholomew Crews, RN Phone Number: [phone] 05/28/2021, 2:43 PM    Transition of Care Department [facility]) has reviewed patient and no TOC needs have been identified at this time. We will continue to monitor patient advancement through interdisciplinary progression rounds. If new patient transition needs arise, please place a TOC consult.  Noted PT evaluation and sign off d/t no PT f/u needs.

## 2021-05-29 MED ORDER — GABAPENTIN 300 MG PO CAPS: 300.0000 mg | ORAL_CAPSULE | Freq: Three times a day (TID) | ORAL | 0 refills | Status: AC

## 2021-05-29 MED ORDER — HYDROMORPHONE HCL 2 MG PO TABS: 1.0000 mg | ORAL_TABLET | ORAL | 0 refills | Status: AC | PRN

## 2021-05-29 NOTE — Progress Notes (Signed)
° ° °  Patient improved with daytime pain on PO meds only averaging 3-4/10 pain. She is still having a tough time in the morning when she first awakes and describes 8/10 pain then, she required IV dilaudid x1 this am to get her pain back under control. She describes the dilaudid as wearing off before 6hrs. She has not been getting her muscle relaxers routinely here and finds her bed at home more comfortable than the hospital bed as well. She expresses some concern about controlling her morning pain at home. She has been cleared by PT/OT and is walking comfortably.    Physical Exam: Vitals:   05/28/21 1900 05/29/21 0753  BP: 127/88 (!) 135/91  Pulse: 90 79  Resp: 14 17  Temp: 98.6 F (37 C) 98.2 F (36.8 C)  SpO2: 99% 96%    Pt up and walking about room comfortably on arrival. TLSO brace worn appropriately. Appears very comfortable. NVI  Improving PO LBP and L leg pain. PO MRI/CT are benign. Pt was admitted for px control and is gradually improving   - up with PT/OT, encourage ambulation - Dilaudid 1mg  Q4hrs PO, Gaba 300-600mg  TID, Robaxin 500mg  Q6 daytime with cyclobenzaprine 5-10mg  QHS.  - likely d/c home today with f/u in 3 weeks

## 2021-05-29 NOTE — Progress Notes (Signed)
Discharge instruction given to the patient and she stated understanding. Questions and concerns answered. IV discontinued.

## 2021-05-29 NOTE — Progress Notes (Signed)
Patient discharged via wheel chair at this time. Patient hemodynamically stable during discharge.  °

## 2021-05-29 NOTE — Plan of Care (Signed)

## 2021-06-01 NOTE — Discharge Summary (Signed)
Patient ID: Kelsey Fitzpatrick MRN: [id number] DOB/AGE: 64-11-1955 64 y.o.  Admit date: 05/27/2021 Discharge date: 05/29/2021  Admission Diagnoses:  Principal Problem:   Post-op pain   Discharge Diagnoses:  Same  Past Medical History:  Diagnosis Date   Abnormal Pap smear 2006   ASCUS   Anxiety    Arthritis    OA    Back pain    Cancer ([facility])    2012 LT ARM MELANOMA    Chronic kidney disease    Blood in urine   Depression    Encounter for insertion of mirena IUD 2008   Fibroids    Osteopenia    PONV (postoperative nausea and vomiting)    Proteinuria    ever since childhood   Shingles    SI (sacroiliac) joint dysfunction    Sleep apnea       Consultants: none  Discharged Condition: Improved  Hospital Course: Lula Kolton is an 66 y.o. female who was admitted 05/27/2021 for treatment of Post-op pain. Patient has severe unremitting pain that affects sleep, daily activities, and work/hobbies.   Anti-infectives (From admission, onward)    Start     Dose/Rate Route Frequency Ordered Stop   05/27/21 1615  doxycycline (MONODOX) capsule 50 mg  Status:  Discontinued        50 mg Oral Daily 05/27/21 1612 05/27/21 2155        Patient was given sequential compression devices, early ambulation to prevent DVT.  Patient benefited maximally from hospital stay and there were no complications.    Recent vital signs: BP (!) 135/91 (BP Location: Left Arm)    Pulse 79    Temp 98.2 F (36.8 C) (Oral)    Resp 17    Ht 5\' 7"  (1.702 m)    Wt 79.8 kg    SpO2 96%    BMI 27.57 kg/m    Discharge Medications:   Allergies as of 05/29/2021       Reactions   Codeine Nausea Only, Other (See Comments)   Dizziness   Hydrocodone Nausea And Vomiting   Doxycycline Hyclate Rash, Other (See Comments)   Numb in finger and toes   Sertraline Rash, Other (See Comments)        Medication List     STOP taking these medications    methylPREDNISolone 4 MG Tbpk tablet Commonly  known as: MEDROL DOSEPAK   oxyCODONE-acetaminophen 5-325 MG tablet Commonly known as: PERCOCET/ROXICET   traMADol 50 MG tablet Commonly known as: ULTRAM       TAKE these medications    acetaminophen 500 MG tablet Commonly known as: TYLENOL Take 1,000 mg by mouth every 6 (six) hours as needed for moderate pain or mild pain.   ALPRAZolam 0.5 MG tablet Commonly known as: XANAX Take 0.25 mg by mouth at bedtime as needed for sleep.   calcium carbonate 1500 (600 Ca) MG Tabs tablet Commonly known as: OSCAL 600 mg of elemental calcium daily with breakfast.   carboxymethylcellulose 0.5 % Soln Commonly known as: REFRESH PLUS Place 1 drop into both eyes daily as needed (lub. eye).   cyclobenzaprine 5 MG tablet Commonly known as: FLEXERIL Take 5-10 mg by mouth at bedtime as needed.   doxycycline 50 MG capsule Commonly known as: MONODOX Take 50 mg by mouth daily.   escitalopram 10 MG tablet Commonly known as: LEXAPRO Take 10 mg by mouth at bedtime.   fluticasone 0.05 % cream Commonly known as: CUTIVATE 1 application daily as  needed (itching).   gabapentin 300 MG capsule Commonly known as: NEURONTIN Take 1-2 capsules (300-600 mg total) by mouth 3 (three) times daily.   HYDROmorphone 2 MG tablet Commonly known as: DILAUDID Take 0.5-1 tablets (1-2 mg total) by mouth every 4 (four) hours as needed for moderate pain or severe pain. What changed:  when to take this reasons to take this   ketotifen 0.025 % ophthalmic solution Commonly known as: ZADITOR Place 1 drop into both eyes 2 (two) times daily as needed (allergies).   M.V.I. ADULT IV Take 1 tablet by mouth daily.   methocarbamol 500 MG tablet Commonly known as: Robaxin Take 1 tablet (500 mg total) by mouth every 6 (six) hours as needed for muscle spasms.   ondansetron 4 MG tablet Commonly known as: Zofran Take 1 tablet (4 mg total) by mouth every 8 (eight) hours as needed for nausea or vomiting.         Diagnostic Studies: DG Lumbar Spine 2-3 Views  Result Date: 05/12/2021 CLINICAL DATA:  Fluoroscopic assistance was provided for lumbar fusion EXAM: LUMBAR SPINE - 2-3 VIEW COMPARISON:  11/29/2020 FINDINGS: Fluoroscopic images show posterior lumbar fusion at L5-S1 level. Intervertebral disc spacer is noted. Fluoroscopic time was 176 seconds. Radiation dose is 122.85 mGy. IMPRESSION: Fluoroscopic assistance was provided for lumbar fusion at L5-S1 level. Electronically Signed   By: Elmer Picker M.D.   On: 05/12/2021 13:49   CT Lumbar Spine Wo Contrast  Result Date: 05/27/2021 CLINICAL DATA:  Low back pain, prior surgery, new symptoms EXAM: CT LUMBAR SPINE WITHOUT CONTRAST TECHNIQUE: Multidetector CT imaging of the lumbar spine was performed without intravenous contrast administration. Multiplanar CT image reconstructions were also generated. RADIATION DOSE REDUCTION: This exam was performed according to the departmental dose-optimization program which includes automated exposure control, adjustment of the mA and/or kV according to patient size and/or use of iterative reconstruction technique. COMPARISON:  Preoperative MRI FINDINGS: Segmentation: 5 lumbar type vertebrae. Alignment: Grade 1 anterolisthesis L5-S1 similar to prior MRI. Vertebrae: Postoperative changes at L5-S1 with rods and pedicle screws and interbody spacer. There is likely some interbody bridging bone centrally. There is associated streak artifact. No evidence of hardware complication. Vertebral body heights are maintained. Paraspinal and other soft tissues: Unremarkable. Disc levels: Minor disc bulges and mild facet arthropathy are present above the operative level without significant canal or foraminal narrowing. The canal and foramina are not well evaluated at the operative level. Endplate osteophytes are present and there is facet hypertrophy. However, there is no apparent significant stenosis. IMPRESSION: Postoperative changes at  L5-S1 without evidence of hardware complication. No apparent new or progressive stenosis. Electronically Signed   By: Macy Mis M.D.   On: 05/27/2021 10:44   MR LUMBAR SPINE WO CONTRAST  Result Date: 05/27/2021 CLINICAL DATA:  Low back pain.  L5-S1 ALIF on 05/12/2021. EXAM: MRI LUMBAR SPINE WITHOUT CONTRAST TECHNIQUE: Multiplanar, multisequence MR imaging of the lumbar spine was performed. No intravenous contrast was administered. COMPARISON:  12/01/2020 and CT scan from 05/27/2021 FINDINGS: Segmentation: The lowest lumbar type non-rib-bearing vertebra is labeled as L5. Alignment: Minimal grade 1 anterolisthesis at L5-S1. Mildly exaggerated lower lumbar lordosis. Vertebrae: Substantial bilateral facet joint effusions at L5-S1. Bilateral Tarlov cysts at the S2-3 level. Intervertebral spacer device at L5-S1. No observed fracture. Conus medullaris and cauda equina: Conus extends to the mid L2 level, borderline low. Conus and cauda equina appear otherwise normal. No distal tethering mass. Paraspinal and other soft tissues: Right greater than left paraspinal  edema the L4-5 level, not unexpected in this postoperative setting. Left SI joint fixation screws noted. Bilateral renal fluid signal intensity lesions favor cysts. Disc levels: L1-2: No impingement.  Shallow right paracentral disc protrusion. L2-3: No impingement. Mild disc bulge and right paracentral annular tear. L3-4: No impingement.  Diffuse disc bulge. L4-5: No impingement. Mild disc bulge with biforaminal annular tears. L5-S1: Borderline left foraminal stenosis due to spurring. IMPRESSION: 1. No overt lumbar impingement. Multilevel degenerative disc disease. 2. Substantial bilateral facet joint effusions at L5-S1. 3. Small bilateral Tarlov cysts at S2-3. 4. Recent ALIF at L5-S1. 5. Borderline low conus medullaris at the mid L2 level, without a visible tethering mass. Electronically Signed   By: Van Clines M.D.   On: 05/27/2021 15:07   DG  C-Arm 1-60 Min-No Report  Result Date: 05/12/2021 Fluoroscopy was utilized by the requesting physician.  No radiographic interpretation.   DG C-Arm 1-60 Min-No Report  Result Date: 05/12/2021 Fluoroscopy was utilized by the requesting physician.  No radiographic interpretation.   DG C-Arm 1-60 Min-No Report  Result Date: 05/12/2021 Fluoroscopy was utilized by the requesting physician.  No radiographic interpretation.   DG C-Arm 1-60 Min-No Report  Result Date: 05/12/2021 Fluoroscopy was utilized by the requesting physician.  No radiographic interpretation.   DG C-Arm 1-60 Min-No Report  Result Date: 05/12/2021 Fluoroscopy was utilized by the requesting physician.  No radiographic interpretation.   DG OR LOCAL ABDOMEN  Result Date: 05/12/2021 CLINICAL DATA:  Status post L5-S1 ALIF. Intraoperative radiograph to rule out foreign body. EXAM: OR LOCAL ABDOMEN COMPARISON:  11/29/2020 FINDINGS: SI joint fusion screws noted. L5-S1 interbody spacer. Coiled wiring projects over the right ischial tuberosity and is almost certainly outside of the patient. No worrisome findings to indicate abnormal retained foreign body. IMPRESSION: No unexpected foreign body. I phoned this result to OR # [phone] at 10:44 a.m. on 05/12/2021. Electronically Signed   By: Van Clines M.D.   On: 05/12/2021 10:46    Disposition: Discharge disposition: 01-Home or Self Care       Discharge Instructions     Discharge patient   Complete by: As directed    Discharge disposition: 01-Home or Self Care   Discharge patient date: 05/29/2021       Improving PO LBP and L leg pain. PO MRI/CT are benign. Pt was admitted for px control and is gradually improving    - up with PT/OT, encourage ambulation - Dilaudid 1mg  Q4hrs PO, Gaba 300-600mg  TID, Robaxin 500mg  Q6 daytime with cyclobenzaprine 5-10mg  QHS.  - d/c home today with f/u in 3 weeks   Signed: Lennie Muckle Alexandro Line 06/01/2021, 1:03 PM

## 2021-07-09 ENCOUNTER — Emergency Department (HOSPITAL_BASED_OUTPATIENT_CLINIC_OR_DEPARTMENT_OTHER): Payer: BC Managed Care – PPO

## 2021-07-09 ENCOUNTER — Encounter (HOSPITAL_BASED_OUTPATIENT_CLINIC_OR_DEPARTMENT_OTHER): Payer: Self-pay | Admitting: Emergency Medicine

## 2021-07-09 ENCOUNTER — Emergency Department (HOSPITAL_BASED_OUTPATIENT_CLINIC_OR_DEPARTMENT_OTHER)
Admission: EM | Admit: 2021-07-09 | Discharge: 2021-07-09 | Disposition: A | Discharge status: Home / Self Care | Payer: BC Managed Care – PPO | Attending: Emergency Medicine | Admitting: Emergency Medicine

## 2021-07-09 ENCOUNTER — Other Ambulatory Visit: Payer: Self-pay

## 2021-07-09 DIAGNOSIS — M7989 Other specified soft tissue disorders: Secondary | ICD-10-CM | POA: Diagnosis present

## 2021-07-09 DIAGNOSIS — I2510 Atherosclerotic heart disease of native coronary artery without angina pectoris: Secondary | ICD-10-CM | POA: Diagnosis not present

## 2021-07-09 NOTE — Discharge Instructions (Addendum)
Your ultrasound did not show any evidence of a blood clot. If you have persistent symptoms you should have the ultrasound repeat in 7-10 days.  ? ?You can wear compression stockings to help reduce the swelling  ? ?Please follow up with your primary care provider within 5-7 days for re-evaluation of your symptoms.  ? ?Please return to the emergency department for any new or worsening symptoms. ? ?

## 2021-07-09 NOTE — ED Notes (Signed)
ED Provider at bedside. 

## 2021-07-09 NOTE — ED Triage Notes (Signed)
Left leg pain in the last week  foot is swollen, had back surgery in jan and it concerned for blood clot sent for CT ?

## 2021-07-09 NOTE — ED Notes (Signed)
EMT-P provided AVS using Teachback Method. Patient verbalizes understanding of Discharge Instructions. Opportunity for Questioning and Answers were provided by EMT-P. Patient Discharged from ED.  ? ?

## 2021-07-09 NOTE — ED Notes (Signed)
Ultrasound at bedside

## 2021-07-09 NOTE — ED Notes (Signed)
Patient ambulatory to bathroom.

## 2021-07-09 NOTE — ED Provider Notes (Signed)
?[facility] ?Provider Note ? ? ?CSN: [id number] ?Arrival date & time: 07/09/21  1310 ? ?  ? ?History ? ?Chief Complaint  ?Patient presents with  ? Leg Pain  ? ? ?Kelsey Fitzpatrick is a 66 y.o. female. ? ?HPI ? ?66 year old female with a history of arthritis, back pain, CAD, depression, fibroids, osteopenia, proteinuria, SI and, who presents to the emergency department today for evaluation of left lower extremity pain and swelling.  She was sent here from urgent care to rule out DVT.  Of note, patient had surgery on her back in January.  Otherwise she denies any tobacco use, estrogen use, history of VTE.  She denies any chest pain or shortness of breath. ? ?Home Medications ?Prior to Admission medications   ?Medication Sig Start Date End Date Taking? Authorizing Provider  ?acetaminophen (TYLENOL) 500 MG tablet Take 1,000 mg by mouth every 6 (six) hours as needed for moderate pain or mild pain.    [provider]  ?ALPRAZolam Duanne Moron) 0.5 MG tablet Take 0.25 mg by mouth at bedtime as needed for sleep.     [provider]  ?calcium carbonate (OSCAL) 1500 (600 Ca) MG TABS tablet 600 mg of elemental calcium daily with breakfast.    [provider]  ?carboxymethylcellulose (REFRESH PLUS) 0.5 % SOLN Place 1 drop into both eyes daily as needed (lub. eye).    [provider]  ?cyclobenzaprine (FLEXERIL) 5 MG tablet Take 5-10 mg by mouth at bedtime as needed. 05/23/21   [provider]  ?doxycycline (MONODOX) 50 MG capsule Take 50 mg by mouth daily. ?Patient not taking: Reported on 05/27/2021    [provider]  ?escitalopram (LEXAPRO) 10 MG tablet Take 10 mg by mouth at bedtime.    [provider]  ?fluticasone (CUTIVATE) 4.09 % cream 1 application daily as needed (itching). ?Patient not taking: Reported on 05/27/2021    [provider]  ?gabapentin (NEURONTIN) 300 MG capsule Take 1-2 capsules (300-600 mg total) by mouth 3  (three) times daily. 05/29/21   McKenzie, Lennie Muckle, PA-C  ?HYDROmorphone (DILAUDID) 2 MG tablet Take 0.5-1 tablets (1-2 mg total) by mouth every 4 (four) hours as needed for moderate pain or severe pain. 05/29/21   McKenzie, Lennie Muckle, PA-C  ?ketotifen (ZADITOR) 0.025 % ophthalmic solution Place 1 drop into both eyes 2 (two) times daily as needed (allergies).    [provider]  ?methocarbamol (ROBAXIN) 500 MG tablet Take 1 tablet (500 mg total) by mouth every 6 (six) hours as needed for muscle spasms. 03/03/20   McKenzie, Lennie Muckle, PA-C  ?Multiple Vitamin (M.V.I. ADULT IV) Take 1 tablet by mouth daily.    [provider]  ?ondansetron (ZOFRAN) 4 MG tablet Take 1 tablet (4 mg total) by mouth every 8 (eight) hours as needed for nausea or vomiting. 03/03/20   McKenzie, Lennie Muckle, PA-C  ?   ? ?Allergies    ?Codeine, Hydrocodone, Doxycycline hyclate, and Sertraline   ? ?Review of Systems   ?Review of Systems ?See HPI for pertinent positives or negatives. ? ? ?Physical Exam ?Updated Vital Signs ?BP (!) 150/100   Pulse 81   Temp 98.4 ?F (36.9 ?C)   Resp 16   Ht '5\' 7"'$  (1.702 m)   Wt 79.8 kg   SpO2 99%   BMI 27.55 kg/m?  ?Physical Exam ?Vitals and nursing note reviewed.  ?Constitutional:   ?   General: She is not in acute distress. ?   Appearance:  She is well-developed.  ?HENT:  ?   Head: Normocephalic and atraumatic.  ?Eyes:  ?   Conjunctiva/sclera: Conjunctivae normal.  ?Cardiovascular:  ?   Rate and Rhythm: Normal rate and regular rhythm.  ?   Heart sounds: No murmur heard. ?Pulmonary:  ?   Effort: Pulmonary effort is normal. No respiratory distress.  ?   Breath sounds: Normal breath sounds.  ?Abdominal:  ?   Palpations: Abdomen is soft.  ?   Tenderness: There is no abdominal tenderness.  ?Musculoskeletal:     ?   General: No swelling.  ?   Cervical back: Neck supple.  ?   Comments: Trace lle edema, mild calf ttp. Dp pulses 2+ and symmetric  ?Lymphadenopathy:  ?   Cervical: No cervical adenopathy.   ?Skin: ?   General: Skin is warm and dry.  ?   Capillary Refill: Capillary refill takes less than 2 seconds.  ?Neurological:  ?   Mental Status: She is alert.  ?Psychiatric:     ?   Mood and Affect: Mood normal.  ? ? ?ED Results / Procedures / Treatments   ?Labs ?(all labs ordered are listed, but only abnormal results are displayed) ?Labs Reviewed - No data to display ? ?EKG ?None ? ?Radiology ?US Venous Img Lower  Left (DVT Study) ? ?Result Date: 07/09/2021 ?CLINICAL DATA:  Left lower extremity pain and edema. Evaluate for DVT. EXAM: LEFT LOWER EXTREMITY VENOUS DOPPLER ULTRASOUND TECHNIQUE: Gray-scale sonography with graded compression, as well as color Doppler and duplex ultrasound were performed to evaluate the lower extremity deep venous systems from the level of the common femoral vein and including the common femoral, femoral, profunda femoral, popliteal and calf veins including the posterior tibial, peroneal and gastrocnemius veins when visible. The superficial great saphenous vein was also interrogated. Spectral Doppler was utilized to evaluate flow at rest and with distal augmentation maneuvers in the common femoral, femoral and popliteal veins. COMPARISON:  None. FINDINGS: Contralateral Common Femoral Vein: Respiratory phasicity is normal and symmetric with the symptomatic side. No evidence of thrombus. Normal compressibility. Common Femoral Vein: No evidence of thrombus. Normal compressibility, respiratory phasicity and response to augmentation. Saphenofemoral Junction: No evidence of thrombus. Normal compressibility and flow on color Doppler imaging. Profunda Femoral Vein: No evidence of thrombus. Normal compressibility and flow on color Doppler imaging. Femoral Vein: No evidence of thrombus. Normal compressibility, respiratory phasicity and response to augmentation. Popliteal Vein: No evidence of thrombus. Normal compressibility, respiratory phasicity and response to augmentation. Calf Veins: No  evidence of thrombus. Normal compressibility and flow on color Doppler imaging. Superficial Great Saphenous Vein: No evidence of thrombus. Normal compressibility. Venous Reflux:  None. Other Findings:  None. IMPRESSION: No evidence of DVT within the left lower extremity. Electronically Signed   By: Sandi Mariscal M.D.   On: 07/09/2021 14:48   ? ?Procedures ?Procedures  ? ? ?Medications Ordered in ED ?Medications - No data to display ? ?ED Course/ Medical Decision Making/ A&P ?  ?                        ?Medical Decision Making ? ?This patient presents to the ED for concern of leg pain and swelling, this involves an extensive number of treatment options, and is a complaint that carries with it a high risk of complications and morbidity.  The differential diagnosis includes but is not limited to dvt, idiopathic edema, chf, cellulitis  ? ?Comorbidities that complicate the patient evaluation: ?  Patient?s presentation is complicated by their history of recent back surgery ? ?Additional history obtained: ?Records reviewed Care Everywhere/External Records and Primary Care Documents ? ?Imaging Studies ordered: ?I ordered, independently visualized, and interpreted imaging which showed  ?LLE [place] - No evidence of DVT within the left lower extremity.  ? ?I agree with the radiologist interpretation ? ?Complexity of problems addressed: ?Patient?s presentation is most consistent with  acute complicated illness/injury requiring diagnostic workup ? ?Disposition: ?After consideration of the diagnostic results and the patient?s response to treatment,  ?I feel that the patent would benefit from discharge home . Pt w/u is reassuring. LLE [place] is negative for DVT. Have advised to have [place] repeated in 7-10 days if sxs persist. Have further advised using compression stockings to help reduce swelling.  ? ?Discussed findings and plan. Advised pcp f/u and return to the ED if no improvement or worsening sxs. Pt voices understanding of the plan and  reasons to return. All questions answered. Pt stable for discharge.  ? ? ?Final Clinical Impression(s) / ED Diagnoses ?Final diagnoses:  ?Left leg swelling  ? ? ?Rx / DC Orders ?ED Discharge Orders   ? ? None  ? ?  ? ? ?  ?[place]

## 2021-09-09 ENCOUNTER — Other Ambulatory Visit: Payer: Self-pay | Admitting: Family Medicine

## 2021-09-09 DIAGNOSIS — Z1231 Encounter for screening mammogram for malignant neoplasm of breast: Secondary | ICD-10-CM

## 2021-10-24 ENCOUNTER — Ambulatory Visit
Admission: RE | Admit: 2021-10-24 | Discharge: 2021-10-24 | Disposition: A | Discharge status: Home / Self Care | Payer: BC Managed Care – PPO | Source: Ambulatory Visit | Attending: Family Medicine | Admitting: Family Medicine

## 2021-10-24 DIAGNOSIS — Z1231 Encounter for screening mammogram for malignant neoplasm of breast: Secondary | ICD-10-CM

## 2021-10-25 ENCOUNTER — Other Ambulatory Visit: Payer: Self-pay | Admitting: Family Medicine

## 2021-10-25 ENCOUNTER — Other Ambulatory Visit: Payer: Self-pay | Admitting: Orthopedic Surgery

## 2021-10-25 DIAGNOSIS — K460 Unspecified abdominal hernia with obstruction, without gangrene: Secondary | ICD-10-CM

## 2021-10-25 DIAGNOSIS — R928 Other abnormal and inconclusive findings on diagnostic imaging of breast: Secondary | ICD-10-CM

## 2021-10-31 ENCOUNTER — Other Ambulatory Visit: Payer: Self-pay | Admitting: Obstetrics and Gynecology

## 2021-10-31 DIAGNOSIS — M8580 Other specified disorders of bone density and structure, unspecified site: Secondary | ICD-10-CM

## 2021-11-03 ENCOUNTER — Ambulatory Visit
Admission: RE | Admit: 2021-11-03 | Discharge: 2021-11-03 | Disposition: A | Discharge status: Home / Self Care | Payer: BC Managed Care – PPO | Source: Ambulatory Visit | Attending: Family Medicine | Admitting: Family Medicine

## 2021-11-03 DIAGNOSIS — R928 Other abnormal and inconclusive findings on diagnostic imaging of breast: Secondary | ICD-10-CM

## 2021-11-23 ENCOUNTER — Other Ambulatory Visit: Payer: BC Managed Care – PPO

## 2021-12-13 ENCOUNTER — Other Ambulatory Visit: Payer: Self-pay | Admitting: Family Medicine

## 2021-12-13 DIAGNOSIS — N6320 Unspecified lump in the left breast, unspecified quadrant: Secondary | ICD-10-CM

## 2021-12-22 ENCOUNTER — Other Ambulatory Visit: Payer: BC Managed Care – PPO

## 2022-01-12 ENCOUNTER — Other Ambulatory Visit: Payer: BC Managed Care – PPO

## 2022-01-24 ENCOUNTER — Ambulatory Visit
Admission: RE | Admit: 2022-01-24 | Discharge: 2022-01-24 | Disposition: A | Discharge status: Home / Self Care | Payer: BC Managed Care – PPO | Source: Ambulatory Visit | Attending: Obstetrics and Gynecology | Admitting: Obstetrics and Gynecology

## 2022-01-24 DIAGNOSIS — M8580 Other specified disorders of bone density and structure, unspecified site: Secondary | ICD-10-CM

## 2022-03-21 ENCOUNTER — Ambulatory Visit (HOSPITAL_BASED_OUTPATIENT_CLINIC_OR_DEPARTMENT_OTHER)
Admission: RE | Admit: 2022-03-21 | Discharge: 2022-03-21 | Disposition: A | Discharge status: Home / Self Care | Payer: BC Managed Care – PPO | Source: Ambulatory Visit | Attending: Physician Assistant | Admitting: Physician Assistant

## 2022-03-21 ENCOUNTER — Other Ambulatory Visit (HOSPITAL_BASED_OUTPATIENT_CLINIC_OR_DEPARTMENT_OTHER): Payer: Self-pay | Admitting: Physician Assistant

## 2022-03-21 DIAGNOSIS — R600 Localized edema: Secondary | ICD-10-CM

## 2022-05-02 ENCOUNTER — Encounter (HOSPITAL_BASED_OUTPATIENT_CLINIC_OR_DEPARTMENT_OTHER): Payer: Self-pay | Admitting: Emergency Medicine

## 2022-05-02 ENCOUNTER — Other Ambulatory Visit: Payer: Self-pay

## 2022-05-02 ENCOUNTER — Emergency Department (HOSPITAL_BASED_OUTPATIENT_CLINIC_OR_DEPARTMENT_OTHER): Payer: BC Managed Care – PPO

## 2022-05-02 ENCOUNTER — Emergency Department (HOSPITAL_BASED_OUTPATIENT_CLINIC_OR_DEPARTMENT_OTHER): Payer: BC Managed Care – PPO | Admitting: Radiology

## 2022-05-02 ENCOUNTER — Emergency Department (HOSPITAL_BASED_OUTPATIENT_CLINIC_OR_DEPARTMENT_OTHER)
Admission: EM | Admit: 2022-05-02 | Discharge: 2022-05-02 | Disposition: A | Discharge status: Home / Self Care | Payer: BC Managed Care – PPO | Attending: Emergency Medicine | Admitting: Emergency Medicine

## 2022-05-02 DIAGNOSIS — R0602 Shortness of breath: Secondary | ICD-10-CM | POA: Diagnosis present

## 2022-05-02 DIAGNOSIS — I700 Atherosclerosis of aorta: Secondary | ICD-10-CM | POA: Insufficient documentation

## 2022-05-02 LAB — CBC
HCT: 42 % (ref 36.0–46.0)
Hemoglobin: 13.5 g/dL (ref 12.0–15.0)
MCH: 30.1 pg (ref 26.0–34.0)
MCHC: 32.1 g/dL (ref 30.0–36.0)
MCV: 93.8 fL (ref 80.0–100.0)
Platelets: 320 10*3/uL (ref 150–400)
RBC: 4.48 MIL/uL (ref 3.87–5.11)
RDW: 14.2 % (ref 11.5–15.5)
WBC: 8.8 10*3/uL (ref 4.0–10.5)
nRBC: 0 % (ref 0.0–0.2)

## 2022-05-02 LAB — TROPONIN I (HIGH SENSITIVITY)
Troponin I (High Sensitivity): 3 ng/L (ref <18)
Troponin I (High Sensitivity): 3 ng/L (ref <18)

## 2022-05-02 LAB — BASIC METABOLIC PANEL
Anion gap: 9 (ref 5–15)
BUN: 26 mg/dL — ABNORMAL HIGH (ref 8–23)
CO2: 29 mmol/L (ref 22–32)
Calcium: 10.3 mg/dL (ref 8.9–10.3)
Chloride: 101 mmol/L (ref 98–111)
Creatinine, Ser: 0.7 mg/dL (ref 0.44–1.00)
GFR, Estimated: >60 mL/min (ref >60)
Glucose, Bld: 89 mg/dL (ref 70–99)
Potassium: 4 mmol/L (ref 3.5–5.1)
Sodium: 139 mmol/L (ref 135–145)

## 2022-05-02 MED ORDER — IOHEXOL 350 MG/ML SOLN
100.0000 mL | Freq: Once | INTRAVENOUS | Status: AC | PRN
  Administered 2022-05-02: 75 mL via INTRAVENOUS

## 2022-05-02 NOTE — ED Provider Notes (Signed)
[facility] EMERGENCY DEPT Provider Note   CSN: [id number] Arrival date & time: 05/02/22  1537     History  Chief Complaint  Patient presents with   Shortness of Breath    Kelsey Fitzpatrick is a 67 y.o. female.  Patient is a 67 year old female with no significant past medical history presenting to ED for concerns for pulmonary elbolism after elevated d-dimer ordered by pcp for complaints of chest tightness. Patient admits to flu diagnosis approximately 20 days ago.  States she has recovered from her upper respiratory infection symptoms including persistent cough with the help of her primary care physician however has had new chest tightness that has developed.  She denies any diaphoresis, nausea, or vomiting during episodes of chest tightness. She states chest tightness is intermittent without exacerbating or alleviating activities. She denies wheezing. She denies any lower leg swelling or calf tenderness.  She does admit to a previous history of a superficial DVT.  No blood thinner use.    No previous history of myocardial infarction.    The history is provided by the patient. No language interpreter was used.  Shortness of Breath Associated symptoms: no abdominal pain, no chest pain, no cough, no ear pain, no fever, no rash, no sore throat and no vomiting        Home Medications Prior to Admission medications   Medication Sig Start Date End Date Taking? Authorizing Provider  acetaminophen (TYLENOL) 500 MG tablet Take 1,000 mg by mouth every 6 (six) hours as needed for moderate pain or mild pain.    [provider]  ALPRAZolam Duanne Moron) 0.5 MG tablet Take 0.25 mg by mouth at bedtime as needed for sleep.     [provider]  calcium carbonate (OSCAL) 1500 (600 Ca) MG TABS tablet 600 mg of elemental calcium daily with breakfast.    [provider]  carboxymethylcellulose (REFRESH PLUS) 0.5 % SOLN Place 1 drop into both eyes daily as needed  (lub. eye).    [provider]  cyclobenzaprine (FLEXERIL) 5 MG tablet Take 5-10 mg by mouth at bedtime as needed. 05/23/21   [provider]  doxycycline (MONODOX) 50 MG capsule Take 50 mg by mouth daily. Patient not taking: Reported on 05/27/2021    [provider]  escitalopram (LEXAPRO) 10 MG tablet Take 10 mg by mouth at bedtime.    [provider]  fluticasone (CUTIVATE) 6.44 % cream 1 application daily as needed (itching). Patient not taking: Reported on 05/27/2021    [provider]  gabapentin (NEURONTIN) 300 MG capsule Take 1-2 capsules (300-600 mg total) by mouth 3 (three) times daily. 05/29/21   McKenzie, Lennie Muckle, PA-C  HYDROmorphone (DILAUDID) 2 MG tablet Take 0.5-1 tablets (1-2 mg total) by mouth every 4 (four) hours as needed for moderate pain or severe pain. 05/29/21   McKenzie, Lennie Muckle, PA-C  ketotifen (ZADITOR) 0.025 % ophthalmic solution Place 1 drop into both eyes 2 (two) times daily as needed (allergies).    [provider]  methocarbamol (ROBAXIN) 500 MG tablet Take 1 tablet (500 mg total) by mouth every 6 (six) hours as needed for muscle spasms. 03/03/20   McKenzie, Lennie Muckle, PA-C  Multiple Vitamin (M.V.I. ADULT IV) Take 1 tablet by mouth daily.    [provider]  ondansetron (ZOFRAN) 4 MG tablet Take 1 tablet (4 mg total) by mouth every 8 (eight) hours as needed for nausea or vomiting. 03/03/20   McKenzie, Lennie Muckle, PA-C  Allergies    Codeine, Hydrocodone, Doxycycline hyclate, and Sertraline    Review of Systems   Review of Systems  Constitutional:  Negative for chills and fever.  HENT:  Negative for ear pain and sore throat.   Eyes:  Negative for pain and visual disturbance.  Respiratory:  Positive for chest tightness and shortness of breath. Negative for cough.   Cardiovascular:  Negative for chest pain and palpitations.  Gastrointestinal:  Negative for abdominal pain and vomiting.  Genitourinary:   Negative for dysuria and hematuria.  Musculoskeletal:  Negative for arthralgias and back pain.  Skin:  Negative for color change and rash.  Neurological:  Negative for seizures and syncope.  All other systems reviewed and are negative.   Physical Exam Updated Vital Signs BP (!) 154/93   Pulse 66   Temp 97.9 F (36.6 C)   Resp 17   SpO2 100%  Physical Exam Vitals and nursing note reviewed.  Constitutional:      General: She is not in acute distress.    Appearance: She is well-developed.  HENT:     Head: Normocephalic and atraumatic.  Eyes:     Conjunctiva/sclera: Conjunctivae normal.  Cardiovascular:     Rate and Rhythm: Normal rate and regular rhythm.     Heart sounds: No murmur heard. Pulmonary:     Effort: Pulmonary effort is normal. No respiratory distress.     Breath sounds: Normal breath sounds.  Abdominal:     Palpations: Abdomen is soft.     Tenderness: There is no abdominal tenderness.  Musculoskeletal:        General: No swelling.     Cervical back: Neck supple.  Skin:    General: Skin is warm and dry.     Capillary Refill: Capillary refill takes less than 2 seconds.  Neurological:     Mental Status: She is alert.  Psychiatric:        Mood and Affect: Mood normal.     ED Results / Procedures / Treatments   Labs (all labs ordered are listed, but only abnormal results are displayed) Labs Reviewed  BASIC METABOLIC PANEL - Abnormal; Notable for the following components:      Result Value   BUN 26 (*)    All other components within normal limits  CBC  TROPONIN I (HIGH SENSITIVITY)  TROPONIN I (HIGH SENSITIVITY)    EKG EKG Interpretation  Date/Time:  Tuesday May 02 2022 15:48:18 EST Ventricular Rate:  70 PR Interval:  150 QRS Duration: 88 QT Interval:  416 QTC Calculation: 449 R Axis:   51 Text Interpretation: Normal sinus rhythm Normal ECG When compared with ECG of 07-Feb-2012 12:04, Nonspecific T wave abnormality now evident in Anterior  leads Confirmed by Campbell Stall ([id number]) on 0/96/0454 4:42:49 PM  Radiology No results found.  Procedures Procedures    Medications Ordered in ED Medications  iohexol (OMNIPAQUE) 350 MG/ML injection 100 mL (75 mLs Intravenous Contrast Given 05/02/22 1701)    ED Course/ Medical Decision Making/ A&P                             Medical Decision Making Amount and/or Complexity of Data Reviewed Labs: ordered. Radiology: ordered.  Risk Prescription drug management.   23:35 AM 67 year old female with no significant past medical history presenting for complaints of chest tightness. Pt is Aox3, no acute distress, afebrile, with stable vitals. No respiratory distress. No hypoxia. Pt has equal bilateral  breath sounds with no adventitious breath sounds.   The patient's chest tightness is not suggestive of pulmonary embolus, cardiac ischemia, aortic dissection, pericarditis, myocarditis, pulmonary embolism, pneumothorax, pneumonia, Zoster, or esophageal perforation, or other serious etiology.  Historically not abrupt in onset, tearing or ripping, pulses symmetric. EKG nonspecific for ischemia/infarction. No dysrhythmias, brugada, WPW, prolonged QT noted. CXR reviewed and WNL. Troponin negative.   CTPE demonstrates: 1. No evidence of pulmonary embolus or acute airspace opacity. 2. Mild aortic Atherosclerosis  Patient in no distress. Symptoms possibly musculoskeletal in nature, costochondritis. Detailed discussions were had with the patient regarding current findings, and need for close f/u with PCP or on call doctor. The patient has been instructed to return immediately if the symptoms worsen in any way for re-evaluation. Patient verbalized understanding and is in agreement with current care plan. All questions answered prior to discharge.          Final Clinical Impression(s) / ED Diagnoses Final diagnoses:  SOB (shortness of breath)    Rx / DC Orders ED Discharge Orders     None          Lianne Cure, DO 92/33/00 [phone]

## 2022-05-02 NOTE — ED Triage Notes (Signed)
Sob and chest tightness, flu 3 weeks ago. Started with chest pain, tightness and sob Seen by PCP, suggested r/o PE   Hx of superficial blood clot

## 2022-05-02 NOTE — ED Notes (Signed)
Paperwork from PCP Pahwani advises ddimer to be 1.23

## 2022-05-10 ENCOUNTER — Ambulatory Visit
Admission: RE | Admit: 2022-05-10 | Discharge: 2022-05-10 | Disposition: A | Discharge status: Home / Self Care | Payer: BC Managed Care – PPO | Source: Ambulatory Visit | Attending: Family Medicine | Admitting: Family Medicine

## 2022-05-10 ENCOUNTER — Other Ambulatory Visit: Payer: Self-pay | Admitting: Family Medicine

## 2022-05-10 DIAGNOSIS — N6320 Unspecified lump in the left breast, unspecified quadrant: Secondary | ICD-10-CM

## 2022-08-10 ENCOUNTER — Emergency Department (HOSPITAL_BASED_OUTPATIENT_CLINIC_OR_DEPARTMENT_OTHER)
Admission: EM | Admit: 2022-08-10 | Discharge: 2022-08-10 | Disposition: A | Discharge status: Home / Self Care | Payer: BC Managed Care – PPO | Attending: Emergency Medicine | Admitting: Emergency Medicine

## 2022-08-10 ENCOUNTER — Other Ambulatory Visit (HOSPITAL_BASED_OUTPATIENT_CLINIC_OR_DEPARTMENT_OTHER): Payer: Self-pay

## 2022-08-10 ENCOUNTER — Emergency Department (HOSPITAL_BASED_OUTPATIENT_CLINIC_OR_DEPARTMENT_OTHER): Payer: BC Managed Care – PPO | Admitting: Radiology

## 2022-08-10 ENCOUNTER — Encounter (HOSPITAL_BASED_OUTPATIENT_CLINIC_OR_DEPARTMENT_OTHER): Payer: Self-pay | Admitting: Emergency Medicine

## 2022-08-10 ENCOUNTER — Other Ambulatory Visit: Payer: Self-pay

## 2022-08-10 DIAGNOSIS — R079 Chest pain, unspecified: Secondary | ICD-10-CM | POA: Diagnosis not present

## 2022-08-10 LAB — CBC
HCT: 40.9 % (ref 36.0–46.0)
Hemoglobin: 13.2 g/dL (ref 12.0–15.0)
MCH: 30.5 pg (ref 26.0–34.0)
MCHC: 32.3 g/dL (ref 30.0–36.0)
MCV: 94.5 fL (ref 80.0–100.0)
Platelets: 243 10*3/uL (ref 150–400)
RBC: 4.33 MIL/uL (ref 3.87–5.11)
RDW: 14.6 % (ref 11.5–15.5)
WBC: 7 10*3/uL (ref 4.0–10.5)
nRBC: 0 % (ref 0.0–0.2)

## 2022-08-10 LAB — BASIC METABOLIC PANEL
Anion gap: 7 (ref 5–15)
BUN: 24 mg/dL — ABNORMAL HIGH (ref 8–23)
CO2: 29 mmol/L (ref 22–32)
Calcium: 9.5 mg/dL (ref 8.9–10.3)
Chloride: 103 mmol/L (ref 98–111)
Creatinine, Ser: 0.71 mg/dL (ref 0.44–1.00)
GFR, Estimated: >60 mL/min (ref >60)
Glucose, Bld: 85 mg/dL (ref 70–99)
Potassium: 4.1 mmol/L (ref 3.5–5.1)
Sodium: 139 mmol/L (ref 135–145)

## 2022-08-10 LAB — TROPONIN I (HIGH SENSITIVITY)
Troponin I (High Sensitivity): 3 ng/L (ref <18)
Troponin I (High Sensitivity): 3 ng/L (ref <18)

## 2022-08-10 MED ORDER — ASPIRIN 81 MG PO CHEW
324.0000 mg | CHEWABLE_TABLET | Freq: Once | ORAL | Status: AC
  Administered 2022-08-10: 324 mg via ORAL
  Filled 2022-08-10: qty 4

## 2022-08-10 MED ORDER — CYCLOBENZAPRINE HCL 5 MG PO TABS
5.0000 mg | ORAL_TABLET | Freq: Once | ORAL | Status: AC
  Administered 2022-08-10: 5 mg via ORAL
  Filled 2022-08-10: qty 1

## 2022-08-10 MED ORDER — CYCLOBENZAPRINE HCL 10 MG PO TABS
10.0000 mg | ORAL_TABLET | Freq: Two times a day (BID) | ORAL | 0 refills | Status: AC | PRN
  Filled 2022-08-10: qty 20, 10d supply, fill #0

## 2022-08-10 MED ORDER — ACETAMINOPHEN 325 MG PO TABS
650.0000 mg | ORAL_TABLET | Freq: Once | ORAL | Status: AC
  Administered 2022-08-10: 650 mg via ORAL
  Filled 2022-08-10: qty 2

## 2022-08-10 NOTE — ED Notes (Signed)
Pt verbalized understanding of d/c instructions, meds, and followup care. Denies questions. VSS, no distress noted. Steady gait to exit with all belongings.  ?

## 2022-08-10 NOTE — Discharge Instructions (Addendum)
Your chest pain appears most consistent with chest wall pain. You were evaluated here in the emergency department with heart enzymes, chest x-[facility], EKG, CBC, and basic metabolic panel. These were all normal. Please continue acetaminophen, ibuprofen, and skeletal muscle relaxants as needed for your pain If you have any changes such as worsening pain, new symptoms such as shortness of breath or other problems please return immediately to the emergency department Please call your doctor for follow-up in the next 2 to 4 days.

## 2022-08-10 NOTE — ED Triage Notes (Signed)
Pt arrives pov, steady gait with c/o LT side CP radiating to LT side neck this morning

## 2022-08-10 NOTE — ED Provider Notes (Signed)
[facility] EMERGENCY DEPARTMENT AT [facility] Provider Note   CSN: [id number] Arrival date & time: 08/10/22  1051     History  Chief Complaint  Patient presents with   Chest Pain    Kelsey Fitzpatrick is a 67 y.o. female.  HPI 67 yo female chest pain began today about 10 am while working on computer.  Some heaviness with ache radiated to left collarbone from left anteior chest No improving Some worse with palpation No risk factor No intervention.     Home Medications Prior to Admission medications   Medication Sig Start Date End Date Taking? Authorizing Provider  acetaminophen (TYLENOL) 500 MG tablet Take 1,000 mg by mouth every 6 (six) hours as needed for moderate pain or mild pain.    [provider]  ALPRAZolam Prudy Feeler) 0.5 MG tablet Take 0.25 mg by mouth at bedtime as needed for sleep.     [provider]  calcium carbonate (OSCAL) 1500 (600 Ca) MG TABS tablet 600 mg of elemental calcium daily with breakfast.    [provider]  carboxymethylcellulose (REFRESH PLUS) 0.5 % SOLN Place 1 drop into both eyes daily as needed (lub. eye).    [provider]  cyclobenzaprine (FLEXERIL) 5 MG tablet Take 5-10 mg by mouth at bedtime as needed. 05/23/21   [provider]  escitalopram (LEXAPRO) 10 MG tablet Take 10 mg by mouth at bedtime.    [provider]  gabapentin (NEURONTIN) 300 MG capsule Take 1-2 capsules (300-600 mg total) by mouth 3 (three) times daily. 05/29/21   McKenzie, Eilene Ghazi, PA-C  HYDROmorphone (DILAUDID) 2 MG tablet Take 0.5-1 tablets (1-2 mg total) by mouth every 4 (four) hours as needed for moderate pain or severe pain. 05/29/21   McKenzie, Eilene Ghazi, PA-C  ketotifen (ZADITOR) 0.025 % ophthalmic solution Place 1 drop into both eyes 2 (two) times daily as needed (allergies).    [provider]  methocarbamol (ROBAXIN) 500 MG tablet Take 1 tablet (500 mg total) by mouth every 6 (six) hours as needed  for muscle spasms. 03/03/20   McKenzie, Eilene Ghazi, PA-C  Multiple Vitamin (M.V.I. ADULT IV) Take 1 tablet by mouth daily.    [provider]  ondansetron (ZOFRAN) 4 MG tablet Take 1 tablet (4 mg total) by mouth every 8 (eight) hours as needed for nausea or vomiting. 03/03/20   McKenzie, Eilene Ghazi, PA-C      Allergies    Codeine, Hydrocodone, Doxycycline hyclate, and Sertraline    Review of Systems   Review of Systems  Physical Exam Updated Vital Signs BP (!) 141/98   Pulse 65   Temp 98.1 F (36.7 C) (Oral)   Resp 16   Ht 1.702 m ( )   Wt 81.6 kg   SpO2 100%   BMI 28.19 kg/m  Physical Exam Vitals reviewed.  Constitutional:      General: She is not in acute distress.    Appearance: She is well-developed.  HENT:     Head: Normocephalic.  Eyes:     Pupils: Pupils are equal, round, and reactive to light.  Cardiovascular:     Rate and Rhythm: Normal rate and regular rhythm.     Heart sounds: Normal heart sounds.  Pulmonary:     Effort: Pulmonary effort is normal.     Breath sounds: Normal breath sounds.  Abdominal:     General: Bowel sounds are normal.     Palpations: Abdomen is soft.  Musculoskeletal:  General: Normal range of motion.     Cervical back: Normal range of motion.     Right lower leg: No tenderness. No edema.     Left lower leg: No tenderness. No edema.  Skin:    General: Skin is warm and dry.     Capillary Refill: Capillary refill takes less than 2 seconds.  Neurological:     General: No focal deficit present.     Mental Status: She is alert.  Psychiatric:        Mood and Affect: Mood normal.     ED Results / Procedures / Treatments   Labs (all labs ordered are listed, but only abnormal results are displayed) Labs Reviewed  BASIC METABOLIC PANEL - Abnormal; Notable for the following components:      Result Value   BUN 24 (*)    All other components within normal limits  CBC  TROPONIN I (HIGH SENSITIVITY)  TROPONIN I (HIGH  SENSITIVITY)    EKG EKG Interpretation  Date/Time:  Thursday August 10 2022 11:04:39 EDT Ventricular Rate:  58 PR Interval:  160 QRS Duration: 86 QT Interval:  436 QTC Calculation: 428 R Axis:   62 Text Interpretation: Sinus bradycardia ECG OTHERWISE WITHIN NORMAL LIMITS Confirmed by Margarita Grizzle [phone]) on 08/10/2022 11:33:45 AM  Radiology DG Chest 2 View  Result Date: 08/10/2022 CLINICAL DATA:  Chest pain, LEFT upper chest pain since this morning EXAM: CHEST - 2 VIEW COMPARISON:  05/02/2022 FINDINGS: Upper normal heart size. Mediastinal contours and pulmonary vascularity normal. Lungs clear. No pulmonary infiltrate, pleural effusion, or pneumothorax. Osseous structures unremarkable. IMPRESSION: No acute abnormalities. Electronically Signed   By: Ulyses Southward M.D.   On: 08/10/2022 12:31    Procedures .Critical Care  Performed by: Margarita Grizzle, MD Authorized by: Margarita Grizzle, MD   Critical care provider statement:    Critical care time (minutes):  30   Critical care was time spent personally by me on the following activities:  Development of treatment plan with patient or surrogate, discussions with consultants, evaluation of patient's response to treatment, examination of patient, ordering and review of laboratory studies, ordering and review of radiographic studies, ordering and performing treatments and interventions, pulse oximetry, re-evaluation of patient's condition and review of old charts     Medications Ordered in ED Medications  aspirin chewable tablet 324 mg (324 mg Oral Given 08/10/22 1209)  acetaminophen (TYLENOL) tablet 650 mg (650 mg Oral Given 08/10/22 1209)  cyclobenzaprine (FLEXERIL) tablet 5 mg (5 mg Oral Given 08/10/22 1209)    ED Course/ Medical Decision Making/ A&P Clinical Course as of 08/10/22 1434  Thu Aug 10, 2022  1431 EKG reviewed interpreted and within normal limits Troponin and repeat troponin reviewed interpreted within normal limits  [DR]   1432 Chest x-Alexandre Lightsey reviewed interpreted within normal limits and radiologist interpretation concurs [DR]  1432 Hemoglobin: 13.2 CBC and be met reviewed interpreted and within normal limits [DR]    Clinical Course User Index [DR] Margarita Grizzle, MD                             Medical Decision Making Amount and/or Complexity of Data Reviewed Labs: ordered. Radiology: ordered.  Risk OTC drugs. Prescription drug management.   Patient seen and evaluated for chest pain.  Differential diagnosis of serious/life threatening causes of chest pain includes ACS, other diseases of the heart such as myocarditis or pericarditis, lung etiologies such as infection or  pneumothorax, diseases of the great vessels such as aortic dissection or AAA, pulmonary embolism, or GI sources such as cholecystitis or other upper abdominal causes. Given exam, patient's symptoms are most consistent with chest wall pain Doubt ACS- heart score documented, EKG reviewed, Given the timing of pain to ER presentation, delta troponin3 was 3 so doubt NSTEMI troponin and repeat troponin obtained and WNL Doubt myocarditis/pericarditis/tamponade based on history, review of ekg and labs Doubt aortic dissection based on history and review of imaging Doubt intrinsic lung causes such as pneumonia or pneumothorax, based on history, physical exam, and studies obtained. Doubt PE based on history, physical exam, and PERC Doubt acute GI etiology requiring intervention based on history, physical exam and labs. Patient appears stable for discharge. Return precautions and need for follow up discussed and patient voices understanding         Final Clinical Impression(s) / ED Diagnoses Final diagnoses:  Chest pain, unspecified type    Rx / DC Orders ED Discharge Orders     None         Margarita Grizzle, MD 08/10/22 1434

## 2022-08-14 ENCOUNTER — Other Ambulatory Visit (HOSPITAL_BASED_OUTPATIENT_CLINIC_OR_DEPARTMENT_OTHER): Payer: Self-pay

## 2022-11-08 ENCOUNTER — Ambulatory Visit: Admission: RE | Admit: 2022-11-08 | Payer: BC Managed Care – PPO | Source: Ambulatory Visit

## 2022-11-08 ENCOUNTER — Ambulatory Visit
Admission: RE | Admit: 2022-11-08 | Discharge: 2022-11-08 | Disposition: A | Discharge status: Home / Self Care | Payer: BC Managed Care – PPO | Source: Ambulatory Visit | Attending: Family Medicine | Admitting: Family Medicine

## 2022-11-08 DIAGNOSIS — N6320 Unspecified lump in the left breast, unspecified quadrant: Secondary | ICD-10-CM

## 2023-01-13 ENCOUNTER — Emergency Department (HOSPITAL_BASED_OUTPATIENT_CLINIC_OR_DEPARTMENT_OTHER)
Admission: EM | Admit: 2023-01-13 | Discharge: 2023-01-14 | Disposition: A | Discharge status: Home / Self Care | Payer: BC Managed Care – PPO | Attending: Emergency Medicine | Admitting: Emergency Medicine

## 2023-01-13 ENCOUNTER — Other Ambulatory Visit: Payer: Self-pay

## 2023-01-13 DIAGNOSIS — R519 Headache, unspecified: Secondary | ICD-10-CM | POA: Insufficient documentation

## 2023-01-13 DIAGNOSIS — R110 Nausea: Secondary | ICD-10-CM | POA: Diagnosis present

## 2023-01-13 MED ORDER — PROMETHAZINE HCL 25 MG PO TABS
25.0000 mg | ORAL_TABLET | Freq: Once | ORAL | Status: AC
  Administered 2023-01-14: 12.5 mg via ORAL
  Filled 2023-01-13: qty 1

## 2023-01-13 NOTE — ED Triage Notes (Signed)
PT denies CP or SOB at this time.

## 2023-01-13 NOTE — ED Triage Notes (Signed)
Pt to triage c/o N/V with headache 4/10 dull in nature x 6 weeks. Pt denies fever or injury. VSS NAD PT on room air. A/O x 4 skin warm dry intact.

## 2023-01-14 LAB — COMPREHENSIVE METABOLIC PANEL
ALT: 11 U/L (ref 0–44)
AST: 15 U/L (ref 15–41)
Albumin: 4.4 g/dL (ref 3.5–5.0)
Alkaline Phosphatase: 71 U/L (ref 38–126)
Anion gap: 12 (ref 5–15)
BUN: 18 mg/dL (ref 8–23)
CO2: 25 mmol/L (ref 22–32)
Calcium: 10.2 mg/dL (ref 8.9–10.3)
Chloride: 104 mmol/L (ref 98–111)
Creatinine, Ser: 0.71 mg/dL (ref 0.44–1.00)
GFR, Estimated: >60 mL/min (ref >60)
Glucose, Bld: 99 mg/dL (ref 70–99)
Potassium: 3.5 mmol/L (ref 3.5–5.1)
Sodium: 141 mmol/L (ref 135–145)
Total Bilirubin: 0.6 mg/dL (ref 0.3–1.2)
Total Protein: 7.2 g/dL (ref 6.5–8.1)

## 2023-01-14 LAB — CBC WITH DIFFERENTIAL/PLATELET
Abs Immature Granulocytes: 0.02 10*3/uL (ref 0.00–0.07)
Basophils Absolute: 0 10*3/uL (ref 0.0–0.1)
Basophils Relative: 0 %
Eosinophils Absolute: 0.1 10*3/uL (ref 0.0–0.5)
Eosinophils Relative: 1 %
HCT: 43.1 % (ref 36.0–46.0)
Hemoglobin: 14.3 g/dL (ref 12.0–15.0)
Immature Granulocytes: 0 %
Lymphocytes Relative: 20 %
Lymphs Abs: 1.9 10*3/uL (ref 0.7–4.0)
MCH: 30.6 pg (ref 26.0–34.0)
MCHC: 33.2 g/dL (ref 30.0–36.0)
MCV: 92.1 fL (ref 80.0–100.0)
Monocytes Absolute: 0.9 10*3/uL (ref 0.1–1.0)
Monocytes Relative: 9 %
Neutro Abs: 6.7 10*3/uL (ref 1.7–7.7)
Neutrophils Relative %: 70 %
Platelets: 258 10*3/uL (ref 150–400)
RBC: 4.68 MIL/uL (ref 3.87–5.11)
RDW: 14.2 % (ref 11.5–15.5)
WBC: 9.7 10*3/uL (ref 4.0–10.5)
nRBC: 0 % (ref 0.0–0.2)

## 2023-01-14 LAB — TROPONIN I (HIGH SENSITIVITY): Troponin I (High Sensitivity): 13 ng/L (ref <18)

## 2023-01-14 LAB — LIPASE, BLOOD: Lipase: 22 U/L (ref 11–51)

## 2023-01-14 MED ORDER — ONDANSETRON 8 MG PO TBDP: ORAL_TABLET | ORAL | 1 refills | Status: DC

## 2023-01-14 MED ORDER — PROMETHAZINE HCL 25 MG PO TABS: 25.0000 mg | ORAL_TABLET | Freq: Four times a day (QID) | ORAL | 0 refills | Status: AC | PRN

## 2023-01-14 NOTE — ED Notes (Signed)
 RN reviewed discharge instructions with pt. Pt verbalized understanding and had no further questions. VSS upon discharge.  

## 2023-01-14 NOTE — ED Notes (Signed)
Pt. Still experiencing mild nausea, denies any vomiting

## 2023-01-14 NOTE — ED Provider Notes (Signed)
EMERGENCY DEPARTMENT AT [facility] Provider Note   CSN: [id number] Arrival date & time: 01/13/23  2030     History  Chief Complaint  Patient presents with   Nausea    Kelsey Fitzpatrick is a 67 y.o. female.  Patient is a 67 year old female with past medical history of depression, overactive bladder, sleep apnea.  Patient presenting today for evaluation of nausea.  She has been experiencing this sensation for the past 6 weeks.  She reports being taken off of her SSRI 4 weeks ago, but symptoms began before stopping this medication.  She reports mild headache.  This evening she checked her blood pressure at home and it was in the 160s over 100 range and felt as though she needed to be checked out.  She denies to me she is having any chest pain or difficulty breathing.  She denies any fevers or chills  The history is provided by the patient.       Home Medications Prior to Admission medications   Medication Sig Start Date End Date Taking? Authorizing Provider  acetaminophen (TYLENOL) 500 MG tablet Take 1,000 mg by mouth every 6 (six) hours as needed for moderate pain or mild pain.    [provider]  ALPRAZolam Prudy Feeler) 0.5 MG tablet Take 0.25 mg by mouth at bedtime as needed for sleep.     [provider]  calcium carbonate (OSCAL) 1500 (600 Ca) MG TABS tablet 600 mg of elemental calcium daily with breakfast.    [provider]  carboxymethylcellulose (REFRESH PLUS) 0.5 % SOLN Place 1 drop into both eyes daily as needed (lub. eye).    [provider]  cyclobenzaprine (FLEXERIL) 10 MG tablet Take 1 tablet (10 mg total) by mouth 2 (two) times daily as needed for muscle spasms. 08/10/22   Margarita Grizzle, MD  escitalopram (LEXAPRO) 10 MG tablet Take 10 mg by mouth at bedtime.    [provider]  gabapentin (NEURONTIN) 300 MG capsule Take 1-2 capsules (300-600 mg total) by mouth 3 (three) times daily. 05/29/21   McKenzie, Eilene Ghazi, PA-C  HYDROmorphone (DILAUDID) 2 MG tablet Take 0.5-1 tablets (1-2 mg total) by mouth every 4 (four) hours as needed for moderate pain or severe pain. 05/29/21   McKenzie, Eilene Ghazi, PA-C  ketotifen (ZADITOR) 0.025 % ophthalmic solution Place 1 drop into both eyes 2 (two) times daily as needed (allergies).    [provider]  Multiple Vitamin (M.V.I. ADULT IV) Take 1 tablet by mouth daily.    [provider]  ondansetron (ZOFRAN) 4 MG tablet Take 1 tablet (4 mg total) by mouth every 8 (eight) hours as needed for nausea or vomiting. 03/03/20   McKenzie, Eilene Ghazi, PA-C      Allergies    Codeine, Hydrocodone, Doxycycline hyclate, and Sertraline    Review of Systems   Review of Systems  All other systems reviewed and are negative.   Physical Exam Updated Vital Signs BP (!) 189/97   Pulse 67   Temp 98.4 F (36.9 C) (Oral)   Resp 20   Wt 80.7 kg   SpO2 98%   BMI 27.88 kg/m  Physical Exam Vitals and nursing note reviewed.  Constitutional:      General: She is not in acute distress.    Appearance: She is well-developed. She is not diaphoretic.  HENT:     Head: Normocephalic and atraumatic.     Mouth/Throat:     Mouth: Mucous membranes are  moist.     Pharynx: No oropharyngeal exudate or posterior oropharyngeal erythema.  Cardiovascular:     Rate and Rhythm: Normal rate and regular rhythm.     Heart sounds: No murmur heard.    No friction rub. No gallop.  Pulmonary:     Effort: Pulmonary effort is normal. No respiratory distress.     Breath sounds: Normal breath sounds. No wheezing.  Abdominal:     General: Bowel sounds are normal. There is no distension.     Palpations: Abdomen is soft.     Tenderness: There is no abdominal tenderness.  Musculoskeletal:        General: Normal range of motion.     Cervical back: Normal range of motion and neck supple.  Skin:    General: Skin is warm and dry.  Neurological:     General: No focal deficit present.     Mental  Status: She is alert and oriented to person, place, and time. Mental status is at baseline.     Cranial Nerves: No cranial nerve deficit.     Motor: No weakness.     Coordination: Coordination normal.     ED Results / Procedures / Treatments   Labs (all labs ordered are listed, but only abnormal results are displayed) Labs Reviewed  CBC WITH DIFFERENTIAL/PLATELET  COMPREHENSIVE METABOLIC PANEL  LIPASE, BLOOD  TROPONIN I (HIGH SENSITIVITY)    EKG EKG Interpretation Date/Time:  Sunday January 14 2023 00:22:46 EDT Ventricular Rate:  63 PR Interval:  158 QRS Duration:  99 QT Interval:  461 QTC Calculation: 472 R Axis:   15  Text Interpretation: Sinus rhythm Low voltage, precordial leads Normal ECG Confirmed by Geoffery Lyons ([id number]) on 01/14/2023 12:28:05 AM  Radiology No results found.  Procedures Procedures    Medications Ordered in ED Medications  promethazine (PHENERGAN) tablet 25 mg (has no administration in time range)    ED Course/ Medical Decision Making/ A&P  Patient is a 67 year old female presenting with complaints of nausea as described in the HPI.  Patient arrives here with stable vital signs and is clinically well-appearing.  There is no fever.  Physical examination basically unremarkable.  Workup initiated including CBC, metabolic panel, lipase, and troponin, all of which are basically normal.  Patient was hydrated with normal saline and given Zofran for nausea and seems to be feeling somewhat better.  I am uncertain as to the cause of her nausea, but this has been ongoing for several weeks and may be related to changes in her depression medications.  Either way, nothing today appears emergent.  I feel as though patient can safely be discharged with medication for nausea and follow-up with primary doctor.  Final Clinical Impression(s) / ED Diagnoses Final diagnoses:  None    Rx / DC Orders ED Discharge Orders     None         Geoffery Lyons,  MD 01/14/23 0145

## 2023-01-14 NOTE — Discharge Instructions (Addendum)
Begin taking Zofran as prescribed as needed for nausea.  Follow-up with your primary doctor if symptoms are not improving in the next few days.  Also, keep a record of your blood pressures at home and take this with you to your next doctor's appointment.

## 2023-01-15 ENCOUNTER — Other Ambulatory Visit: Payer: Self-pay | Admitting: Internal Medicine

## 2023-01-15 ENCOUNTER — Ambulatory Visit
Admission: RE | Admit: 2023-01-15 | Discharge: 2023-01-15 | Disposition: A | Discharge status: Home / Self Care | Payer: BC Managed Care – PPO | Source: Ambulatory Visit | Attending: Internal Medicine

## 2023-01-15 DIAGNOSIS — R519 Headache, unspecified: Secondary | ICD-10-CM

## 2023-04-20 ENCOUNTER — Encounter (HOSPITAL_BASED_OUTPATIENT_CLINIC_OR_DEPARTMENT_OTHER): Payer: Self-pay | Admitting: Emergency Medicine

## 2023-04-20 ENCOUNTER — Emergency Department (HOSPITAL_BASED_OUTPATIENT_CLINIC_OR_DEPARTMENT_OTHER)
Admission: EM | Admit: 2023-04-20 | Discharge: 2023-04-21 | Disposition: A | Discharge status: Home / Self Care | Payer: 59 | Attending: Emergency Medicine | Admitting: Emergency Medicine

## 2023-04-20 ENCOUNTER — Emergency Department (HOSPITAL_BASED_OUTPATIENT_CLINIC_OR_DEPARTMENT_OTHER): Payer: 59

## 2023-04-20 DIAGNOSIS — R0600 Dyspnea, unspecified: Secondary | ICD-10-CM

## 2023-04-20 DIAGNOSIS — R0602 Shortness of breath: Secondary | ICD-10-CM | POA: Diagnosis present

## 2023-04-20 DIAGNOSIS — J189 Pneumonia, unspecified organism: Secondary | ICD-10-CM | POA: Insufficient documentation

## 2023-04-20 DIAGNOSIS — Z20822 Contact with and (suspected) exposure to covid-19: Secondary | ICD-10-CM | POA: Insufficient documentation

## 2023-04-20 LAB — TROPONIN I (HIGH SENSITIVITY): Troponin I (High Sensitivity): 4 ng/L (ref <18)

## 2023-04-20 LAB — BASIC METABOLIC PANEL
Anion gap: 8 (ref 5–15)
BUN: 26 mg/dL — ABNORMAL HIGH (ref 8–23)
CO2: 28 mmol/L (ref 22–32)
Calcium: 9.5 mg/dL (ref 8.9–10.3)
Chloride: 102 mmol/L (ref 98–111)
Creatinine, Ser: 0.72 mg/dL (ref 0.44–1.00)
GFR, Estimated: >60 mL/min (ref >60)
Glucose, Bld: 84 mg/dL (ref 70–99)
Potassium: 3.7 mmol/L (ref 3.5–5.1)
Sodium: 138 mmol/L (ref 135–145)

## 2023-04-20 LAB — RESP PANEL BY RT-PCR (RSV, FLU A&B, COVID)  RVPGX2
Influenza A by PCR: NEGATIVE
Influenza B by PCR: NEGATIVE
Resp Syncytial Virus by PCR: NEGATIVE
SARS Coronavirus 2 by RT PCR: NEGATIVE

## 2023-04-20 LAB — CBC
HCT: 42.1 % (ref 36.0–46.0)
Hemoglobin: 13.8 g/dL (ref 12.0–15.0)
MCH: 30.5 pg (ref 26.0–34.0)
MCHC: 32.8 g/dL (ref 30.0–36.0)
MCV: 93.1 fL (ref 80.0–100.0)
Platelets: 266 10*3/uL (ref 150–400)
RBC: 4.52 MIL/uL (ref 3.87–5.11)
RDW: 15.4 % (ref 11.5–15.5)
WBC: 7.6 10*3/uL (ref 4.0–10.5)
nRBC: 0 % (ref 0.0–0.2)

## 2023-04-20 MED ORDER — IOHEXOL 350 MG/ML SOLN
100.0000 mL | Freq: Once | INTRAVENOUS | Status: AC | PRN
  Administered 2023-04-20: 75 mL via INTRAVENOUS

## 2023-04-20 MED ORDER — ALBUTEROL SULFATE HFA 108 (90 BASE) MCG/ACT IN AERS
2.0000 | INHALATION_SPRAY | Freq: Once | RESPIRATORY_TRACT | Status: AC
  Administered 2023-04-20: 2 via RESPIRATORY_TRACT
  Filled 2023-04-20: qty 6.7

## 2023-04-20 NOTE — Progress Notes (Signed)
 Pt does not want chest xray. Pt wants to wait until seen by doctor. Says she was sent here for a CT.

## 2023-04-20 NOTE — ED Provider Notes (Signed)
  Provider Note MRN:  [id number]  Arrival date & time: 04/21/23    ED Course and Medical Decision Making  Assumed care from Trifan at shift change.  See note from prior team for complete details, in brief:  68 yo female Pleuritic cp x1 week Sent from UC  Labs negative CT PE pending  Plan per prior physician f/u imaging, if neg  can dc  Clinical Course as of 04/21/23 0057  Sat Apr 21, 2023  0005 CT reviewed, no PE, possible slight PNA noted. She is not septic/no hypoxia.  [SG]    Clinical Course User Index [SG] Elnor Jayson LABOR, DO   She is to remain given albuterol  inhaler.  Will give steroids, azithromycin .  Possible slight pneumonia on CT imaging.  She is not septic, no hypoxia.  Discharge with outpatient follow-up.  Strict return precautions were emphasized  The patient improved significantly and was discharged in stable condition. Detailed discussions were had with the patient regarding current findings, and need for close f/u with PCP or on call doctor. The patient has been instructed to return immediately if the symptoms worsen in any way for re-evaluation. Patient verbalized understanding and is in agreement with current care plan. All questions answered prior to discharge.    Procedures  Final Clinical Impressions(s) / ED Diagnoses     ICD-10-CM   1. Dyspnea, unspecified type  R06.00     2. Community acquired pneumonia, unspecified laterality  J18.9       ED Discharge Orders          Ordered    azithromycin  (ZITHROMAX ) 250 MG tablet  Daily        04/21/23 0056    predniSONE  (STERAPRED UNI-PAK 21 TAB) 10 MG (21) TBPK tablet  Daily        04/21/23 0056              Discharge Instructions      It was a pleasure caring for you today in the emergency department.  Please return to the emergency department for any worsening or worrisome symptoms.          Elnor Jayson LABOR, DO 04/21/23 [phone]

## 2023-04-20 NOTE — ED Notes (Signed)
 ED Provider at bedside.

## 2023-04-20 NOTE — ED Notes (Signed)
 Patient transported to CT

## 2023-04-20 NOTE — ED Triage Notes (Signed)
 Chest pain/tightness Sob X 7 days Neg covid at home Hx dvt  Seen at [facility] sent for eval  (cardiac/PE)

## 2023-04-20 NOTE — ED Provider Notes (Signed)
 [place] [facility] AT [facility] Provider Note   CSN: [id number] Arrival date & time: 04/20/23  1834     History  Chief Complaint  Patient presents with   Chest Pain   Shortness of Breath    Kelsey Fitzpatrick is a 68 y.o. female with a history of greater saphenous vein phlebitis in 2023, presenting to the ED as referral from urgent care with chest tightness this of breath.  Patient reports for the past 7 days she had a sensation that she cannot take a full breath in.  This is been persistent and substernal.  She denies productive cough, fevers or chills.  She went to urgent care was referred to ED for further evaluation with potential concern for PE.  She denies that she has ever had a DVT or PE, she is not anticoagulated at this time.  She was seen approximately 1 year ago for similar symptoms and had a negative PE study at that time.  She denies any unilateral leg pain or swelling.  HPI     Home Medications Prior to Admission medications   Medication Sig Start Date End Date Taking? Authorizing Provider  acetaminophen  (TYLENOL ) 500 MG tablet Take 1,000 mg by mouth every 6 (six) hours as needed for moderate pain or mild pain.    [provider]  ALPRAZolam  (XANAX ) 0.5 MG tablet Take 0.25 mg by mouth at bedtime as needed for sleep.     [provider]  calcium  carbonate (OSCAL) 1500 (600 Ca) MG TABS tablet 600 mg of elemental calcium  daily with breakfast.    [provider]  carboxymethylcellulose (REFRESH PLUS) 0.5 % SOLN Place 1 drop into both eyes daily as needed (lub. eye).    [provider]  cyclobenzaprine  (FLEXERIL ) 10 MG tablet Take 1 tablet (10 mg total) by mouth 2 (two) times daily as needed for muscle spasms. 08/10/22   Levander Houston, MD  escitalopram  (LEXAPRO ) 10 MG tablet Take 10 mg by mouth at bedtime.    [provider]  gabapentin  (NEURONTIN ) 300 MG capsule Take 1-2 capsules (300-600 mg total) by  mouth 3 (three) times daily. 05/29/21   McKenzie, Kayla J, PA-C  HYDROmorphone  (DILAUDID ) 2 MG tablet Take 0.5-1 tablets (1-2 mg total) by mouth every 4 (four) hours as needed for moderate pain or severe pain. 05/29/21   McKenzie, Kayla J, PA-C  ketotifen  (ZADITOR ) 0.025 % ophthalmic solution Place 1 drop into both eyes 2 (two) times daily as needed (allergies).    [provider]  Multiple Vitamin (M.V.I. ADULT  IV) Take 1 tablet by mouth daily.    [provider]  ondansetron  (ZOFRAN ) 4 MG tablet Take 1 tablet (4 mg total) by mouth every 8 (eight) hours as needed for nausea or vomiting. 03/03/20   McKenzie, Kayla J, PA-C  promethazine  (PHENERGAN ) 25 MG tablet Take 1 tablet (25 mg total) by mouth every 6 (six) hours as needed for nausea. 01/14/23   Geroldine Berg, MD      Allergies    Codeine, Hydrocodone , Doxycycline  hyclate, and Sertraline    Review of Systems   Review of Systems  Physical Exam Updated Vital Signs BP (!) 142/90   Pulse 66   Temp 98 F (36.7 C)   Resp 18   SpO2 96%  Physical Exam Constitutional:      General: She is not in acute distress. HENT:     Head: Normocephalic and atraumatic.  Eyes:     Conjunctiva/sclera: Conjunctivae normal.  Pupils: Pupils are equal, round, and reactive to light.  Cardiovascular:     Rate and Rhythm: Normal rate and regular rhythm.  Pulmonary:     Effort: Pulmonary effort is normal. No respiratory distress.  Abdominal:     General: There is no distension.     Tenderness: There is no abdominal tenderness.  Skin:    General: Skin is warm and dry.  Neurological:     General: No focal deficit present.     Mental Status: She is alert. Mental status is at baseline.  Psychiatric:        Mood and Affect: Mood normal.        Behavior: Behavior normal.     ED Results / Procedures / Treatments   Labs (all labs ordered are listed, but only abnormal results are displayed) Labs Reviewed  BASIC METABOLIC PANEL -  Abnormal; Notable for the following components:      Result Value   BUN 26 (*)    All other components within normal limits  RESP PANEL BY RT-PCR (RSV, FLU A&B, COVID)  [id number]  CBC  TROPONIN I (HIGH SENSITIVITY)    EKG EKG Interpretation Date/Time:  Friday April 20 2023 18:45:47 EST Ventricular Rate:  73 PR Interval:  154 QRS Duration:  84 QT Interval:  392 QTC Calculation: 431 R Axis:   36  Text Interpretation: Normal sinus rhythm Normal ECG When compared with ECG of 14-Jan-2023 00:22, PREVIOUS ECG IS PRESENT Confirmed by Cottie Cough [phone]) on 04/20/2023 6:52:49 PM  Radiology No results found.  Procedures Procedures    Medications Ordered in ED Medications  albuterol  (VENTOLIN  HFA) 108 (90 Base) MCG/ACT inhaler 2 puff (2 puffs Inhalation Given 04/20/23 2231)  iohexol  (OMNIPAQUE ) 350 MG/ML injection 100 mL (75 mLs Intravenous Contrast Given 04/20/23 2259)    ED Course/ Medical Decision Making/ A&P                                 Medical Decision Making Amount and/or Complexity of Data Reviewed Labs: ordered. Radiology: ordered.  Risk Prescription drug management.   This patient presents to the ED with concern for shortness of breath. This involves an extensive number of treatment options, and is a complaint that carries with it a high risk of complications and morbidity.  The differential diagnosis includes viral URI versus bronchopneumonia versus bacterial pneumonia or lobar pneumonia versus pulmonary embolism versus other  I ordered and personally interpreted labs.  The pertinent results include: Flu and COVID test are negative.  Troponin negative.  BMP and CBC are within normal limits.  Patient reports she has a chronically elevated D-dimer test which was checked by her doctor in the past.  I do not see utility in repeating this test at this time.  Based on her prior history of thrombophlebitis as well as her description of pleuritic persistent chest pain I  think it is reasonable to proceed with CT imaging to evaluate for both pulmonary embolism or pneumonia.  The patient is in agreement with this plan.  I ordered imaging studies including CT PE study, which is pending  The patient was maintained on a cardiac monitor.  I personally viewed and interpreted the cardiac monitored which showed an underlying rhythm of: Sinus rhythm  Per my interpretation the patient's ECG shows no acute ischemic findings  I ordered medication including butyryl given for potential bronchospasm  I have reviewed the patients home medicines  and have made adjustments as needed   Dispostion:  The patient is signed out to Dr Sheppard Pereyra EDP pending follow up on CT imaging.        Final Clinical Impression(s) / ED Diagnoses Final diagnoses:  None    Rx / DC Orders ED Discharge Orders     None         Cottie Donnice PARAS, MD 04/20/23 2307

## 2023-04-21 MED ORDER — AZITHROMYCIN 250 MG PO TABS: 250.0000 mg | ORAL_TABLET | Freq: Every day | ORAL | 0 refills | Status: AC

## 2023-04-21 MED ORDER — PREDNISONE 10 MG (21) PO TBPK: ORAL_TABLET | Freq: Every day | ORAL | 0 refills | Status: AC

## 2023-04-21 NOTE — ED Notes (Signed)
 RN reviewed discharge instructions with pt. Pt verbalized understanding and had no further questions. VSS upon discharge.

## 2023-04-21 NOTE — Discharge Instructions (Addendum)
 It was a pleasure caring for you today in the emergency department.  Please return to the emergency department for any worsening or worrisome symptoms.

## 2023-04-21 NOTE — ED Notes (Signed)
 ED Provider at bedside.

## 2023-05-09 ENCOUNTER — Ambulatory Visit: Payer: BC Managed Care – PPO | Admitting: Neurology

## 2023-07-16 IMAGING — US US EXTREM LOW VENOUS*L*
1 series · 13 of 24 positions shown · non-contrast
Comparison: None.

CLINICAL DATA: Left lower extremity pain and edema. Evaluate for
DVT.



[Series 1: us venous img lower uni left (dvt) · portal-venous · 13 of 43 slices shown]
[im 1/43]
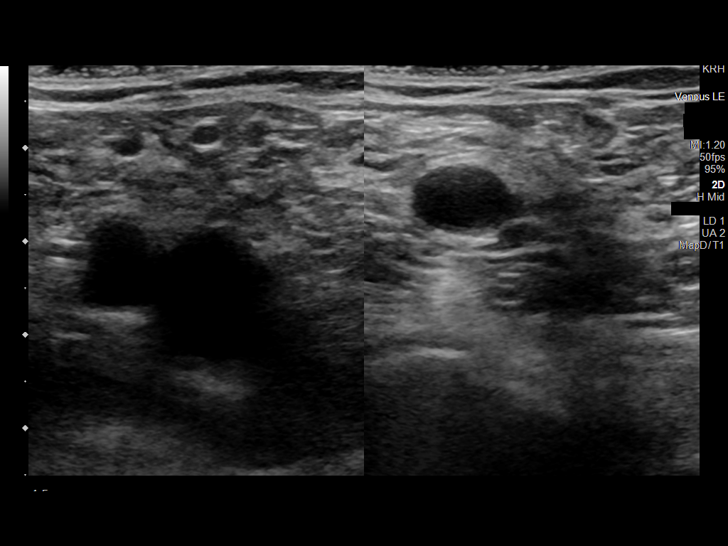
[im 4/43]
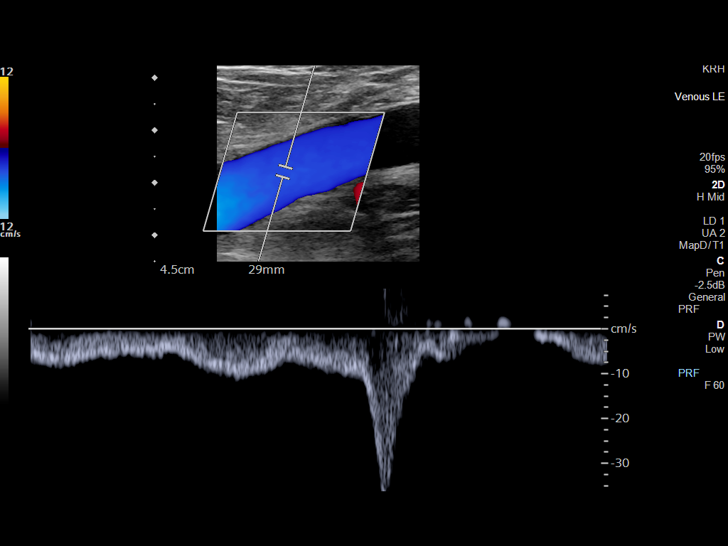
[im 8/43]
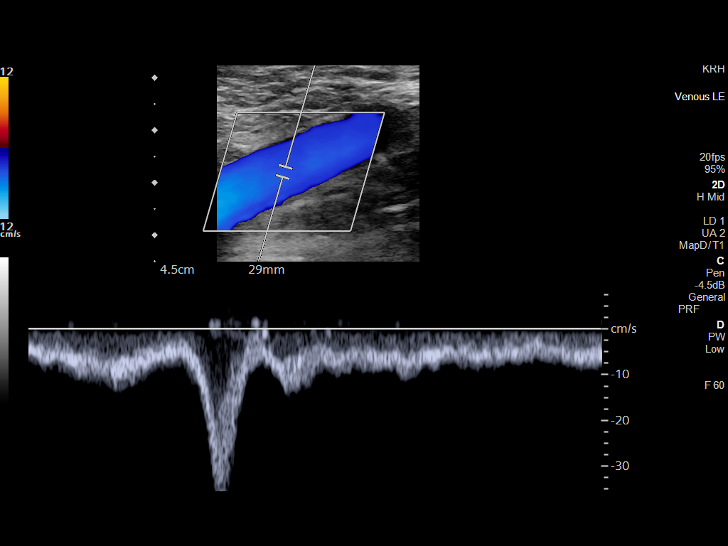
[im 11/43]
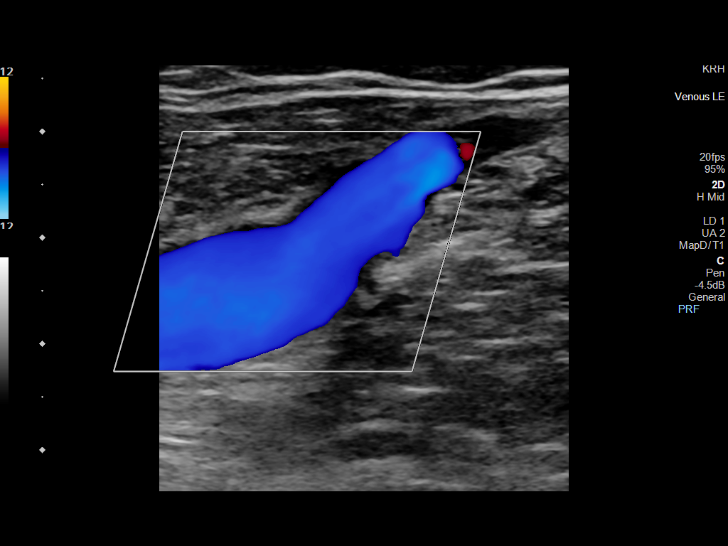
[im 15/43]
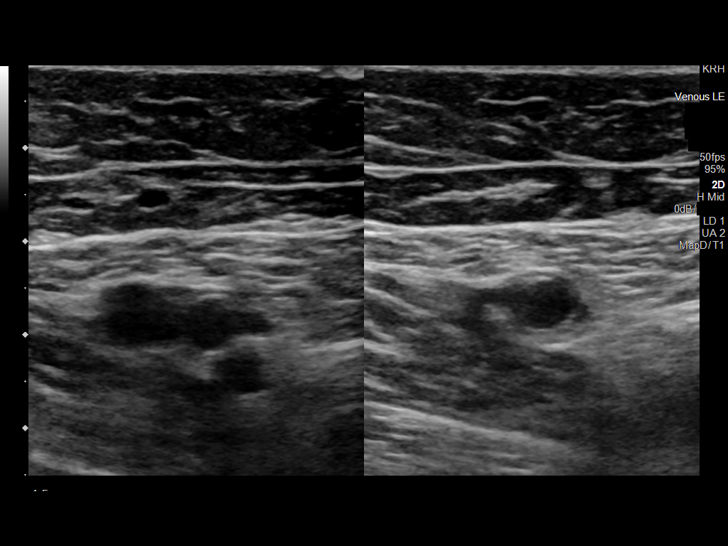
[im 19/43]
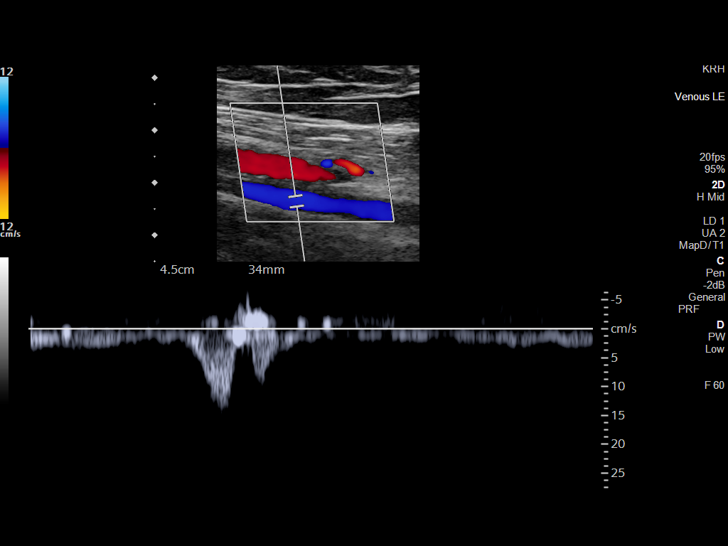
[im 22/43]
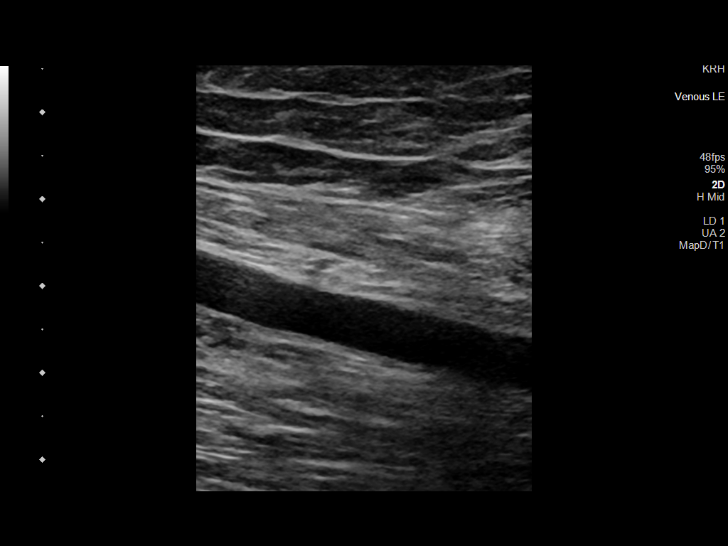
[im 24/43]
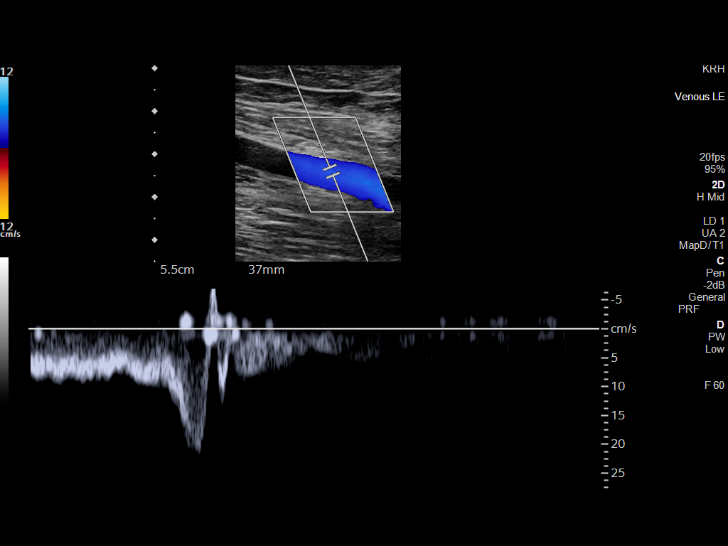
[im 28/43]
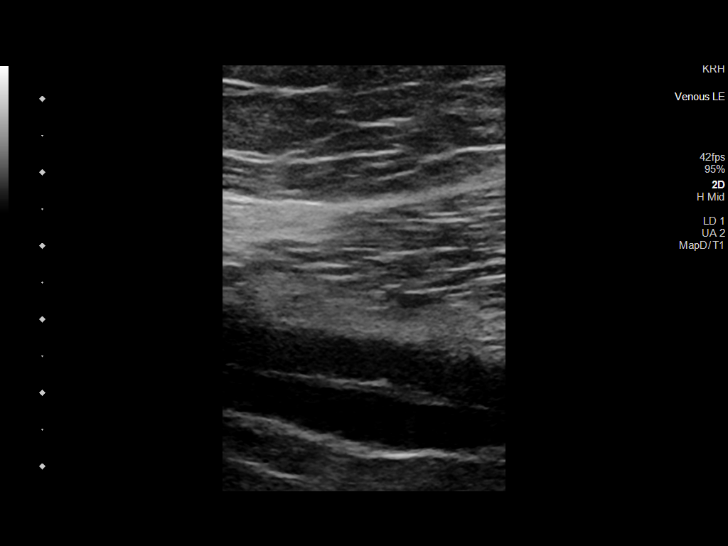
[im 32/43]
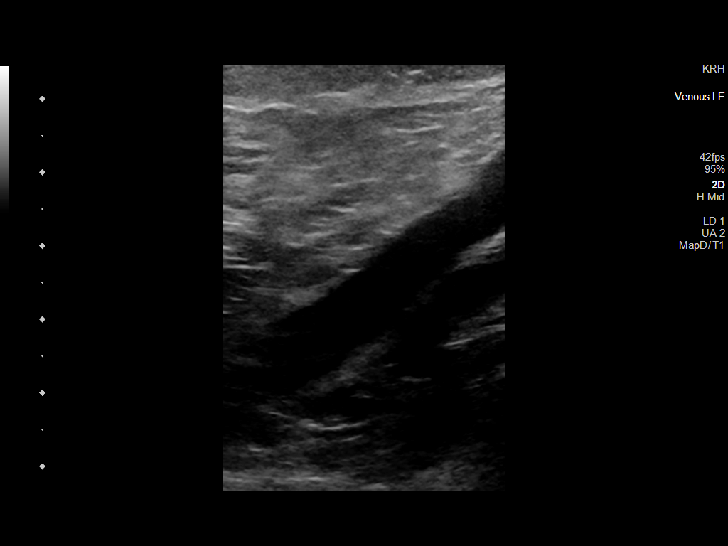
[im 35/43]
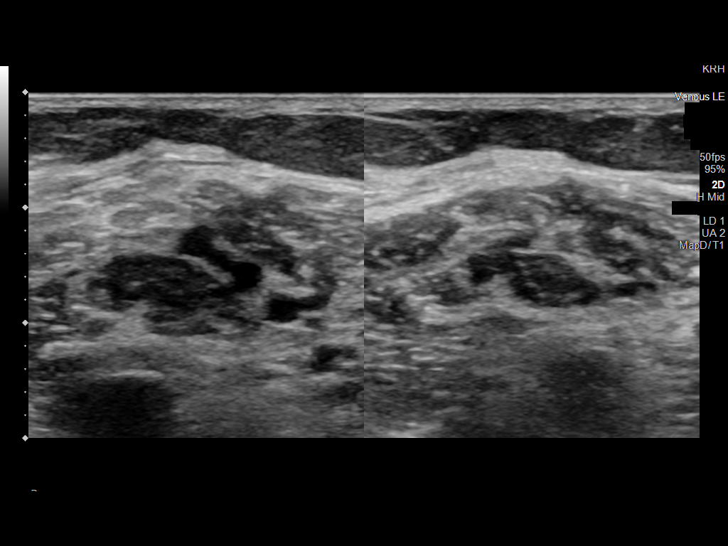
[im 39/43]
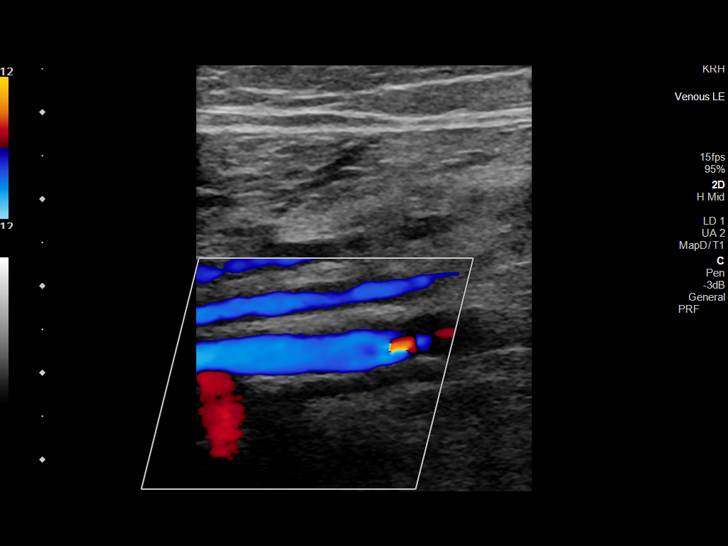
[im 43/43]
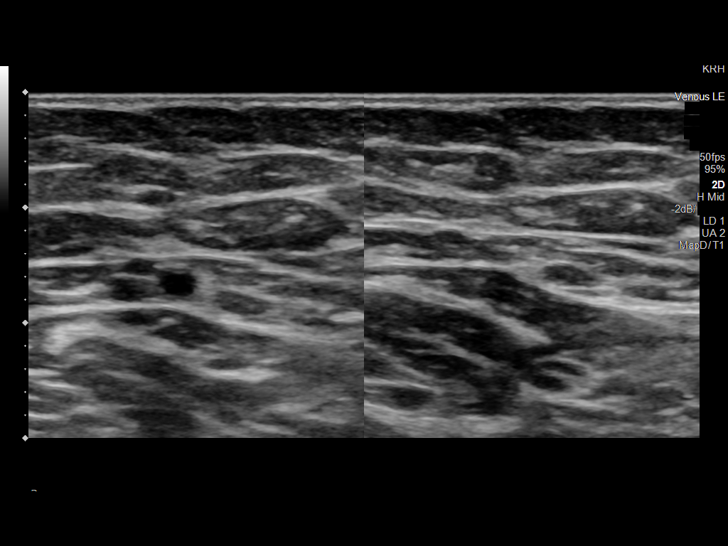

[13 of 24 positions shown; findings below may reference images not displayed]

FINDINGS: Contralateral Common Femoral Vein: Respiratory phasicity is normal
and symmetric with the symptomatic side. No evidence of thrombus.
Normal compressibility.

Common Femoral Vein: No evidence of thrombus. Normal
compressibility, respiratory phasicity and response to augmentation.

Saphenofemoral Junction: No evidence of thrombus. Normal
compressibility and flow on color Doppler imaging.

Profunda Femoral Vein: No evidence of thrombus. Normal
compressibility and flow on color Doppler imaging.

Femoral Vein: No evidence of thrombus. Normal compressibility,
respiratory phasicity and response to augmentation.

Popliteal Vein: No evidence of thrombus. Normal compressibility,
respiratory phasicity and response to augmentation.

Calf Veins: No evidence of thrombus. Normal compressibility and flow
on color Doppler imaging.

Superficial Great Saphenous Vein: No evidence of thrombus. Normal
compressibility.

Venous Reflux:  None.

Other Findings:  None.
IMPRESSION: No evidence of DVT within the left lower extremity.

## 2023-08-13 ENCOUNTER — Encounter (HOSPITAL_BASED_OUTPATIENT_CLINIC_OR_DEPARTMENT_OTHER): Payer: Self-pay

## 2023-08-13 ENCOUNTER — Emergency Department (HOSPITAL_BASED_OUTPATIENT_CLINIC_OR_DEPARTMENT_OTHER)

## 2023-08-13 ENCOUNTER — Emergency Department (HOSPITAL_BASED_OUTPATIENT_CLINIC_OR_DEPARTMENT_OTHER)
Admission: EM | Admit: 2023-08-13 | Discharge: 2023-08-13 | Discharge status: Against Medical Advice | Attending: Emergency Medicine | Admitting: Emergency Medicine

## 2023-08-13 DIAGNOSIS — M79604 Pain in right leg: Secondary | ICD-10-CM | POA: Diagnosis present

## 2023-08-13 DIAGNOSIS — Z5321 Procedure and treatment not carried out due to patient leaving prior to being seen by health care provider: Secondary | ICD-10-CM | POA: Diagnosis not present

## 2023-08-13 NOTE — ED Notes (Signed)
 US  has called pt Kelsey Fitzpatrick 2 and has gone all around the waiting room - no answer

## 2023-08-13 NOTE — ED Triage Notes (Signed)
 Pt c/o pain in R leg- hx of DVT in same area. Denies CP, SHOB. States she was walking a lot this weekend, "have higher ddimers since covid so I try to get checked."

## 2023-08-17 ENCOUNTER — Ambulatory Visit (HOSPITAL_COMMUNITY)
Admission: RE | Admit: 2023-08-17 | Discharge: 2023-08-17 | Disposition: A | Discharge status: Home / Self Care | Source: Ambulatory Visit | Attending: Vascular Surgery | Admitting: Vascular Surgery

## 2023-08-17 ENCOUNTER — Other Ambulatory Visit (HOSPITAL_COMMUNITY): Payer: Self-pay | Admitting: Physician Assistant

## 2023-08-17 DIAGNOSIS — M79661 Pain in right lower leg: Secondary | ICD-10-CM | POA: Insufficient documentation

## 2023-08-17 DIAGNOSIS — M7989 Other specified soft tissue disorders: Secondary | ICD-10-CM | POA: Diagnosis not present

## 2023-10-08 ENCOUNTER — Other Ambulatory Visit: Payer: Self-pay | Admitting: Internal Medicine

## 2023-10-08 DIAGNOSIS — N6320 Unspecified lump in the left breast, unspecified quadrant: Secondary | ICD-10-CM

## 2023-11-21 ENCOUNTER — Ambulatory Visit
Admission: RE | Admit: 2023-11-21 | Discharge: 2023-11-21 | Disposition: A | Discharge status: Home / Self Care | Source: Ambulatory Visit | Attending: Internal Medicine

## 2023-11-21 DIAGNOSIS — N6320 Unspecified lump in the left breast, unspecified quadrant: Secondary | ICD-10-CM

## 2024-05-21 ENCOUNTER — Other Ambulatory Visit (HOSPITAL_BASED_OUTPATIENT_CLINIC_OR_DEPARTMENT_OTHER): Payer: Self-pay | Admitting: Obstetrics and Gynecology

## 2024-05-21 DIAGNOSIS — Z8739 Personal history of other diseases of the musculoskeletal system and connective tissue: Secondary | ICD-10-CM
